# Patient Record
Sex: Female | Born: 1985 | Hispanic: Yes | Marital: Married | Smoking: Never smoker
Health system: Southern US, Community
[De-identification: ages and names within clinical notes are randomized; demographics above are authoritative.]

## PROBLEM LIST (undated history)

## (undated) DIAGNOSIS — Z789 Other specified health status: Secondary | ICD-10-CM

## (undated) DIAGNOSIS — D219 Benign neoplasm of connective and other soft tissue, unspecified: Secondary | ICD-10-CM

## (undated) DIAGNOSIS — E282 Polycystic ovarian syndrome: Secondary | ICD-10-CM

## (undated) DIAGNOSIS — O24419 Gestational diabetes mellitus in pregnancy, unspecified control: Secondary | ICD-10-CM

## (undated) HISTORY — DX: Polycystic ovarian syndrome: E28.2

## (undated) HISTORY — DX: Gestational diabetes mellitus in pregnancy, unspecified control: O24.419

## (undated) HISTORY — DX: Benign neoplasm of connective and other soft tissue, unspecified: D21.9

## (undated) HISTORY — PX: NO PAST SURGERIES: SHX2092

---
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
## 2020-01-06 ENCOUNTER — Encounter (HOSPITAL_COMMUNITY): Payer: Self-pay | Admitting: Obstetrics & Gynecology

## 2020-01-06 ENCOUNTER — Inpatient Hospital Stay (HOSPITAL_COMMUNITY)
Admission: AD | Admit: 2020-01-06 | Discharge: 2020-01-07 | Disposition: A | Discharge status: Home / Self Care | Payer: Self-pay | Attending: Obstetrics & Gynecology | Admitting: Obstetrics & Gynecology

## 2020-01-06 DIAGNOSIS — R102 Pelvic and perineal pain: Secondary | ICD-10-CM | POA: Insufficient documentation

## 2020-01-06 DIAGNOSIS — O209 Hemorrhage in early pregnancy, unspecified: Secondary | ICD-10-CM | POA: Insufficient documentation

## 2020-01-06 DIAGNOSIS — O4690 Antepartum hemorrhage, unspecified, unspecified trimester: Secondary | ICD-10-CM

## 2020-01-06 DIAGNOSIS — Z6790 Unspecified blood type, Rh positive: Secondary | ICD-10-CM

## 2020-01-06 DIAGNOSIS — Z3A09 9 weeks gestation of pregnancy: Secondary | ICD-10-CM | POA: Insufficient documentation

## 2020-01-06 DIAGNOSIS — O26899 Other specified pregnancy related conditions, unspecified trimester: Secondary | ICD-10-CM

## 2020-01-06 DIAGNOSIS — Z3490 Encounter for supervision of normal pregnancy, unspecified, unspecified trimester: Secondary | ICD-10-CM

## 2020-01-06 DIAGNOSIS — B9689 Other specified bacterial agents as the cause of diseases classified elsewhere: Secondary | ICD-10-CM | POA: Insufficient documentation

## 2020-01-06 DIAGNOSIS — Z3A01 Less than 8 weeks gestation of pregnancy: Secondary | ICD-10-CM

## 2020-01-06 DIAGNOSIS — O23591 Infection of other part of genital tract in pregnancy, first trimester: Secondary | ICD-10-CM | POA: Insufficient documentation

## 2020-01-06 LAB — POCT PREGNANCY, URINE: Preg Test, Ur: POSITIVE — AB

## 2020-01-06 NOTE — MAU Note (Signed)
Pt reports to MAU and states that she is pregnant and is bleeding. Pt states that bleeding started at 2230. Pt states she is having pain on her left and right side of her ovaries. Pt states last menstrual period was on 11/02/19. Pt states pain a 5/10. Pt states pain is cramping "not very strong, but I do get them". Pt states that this is her first pregnancy. Pt reports pain in her mid back + CVA tenderness. Pt reports having burning when she pees starting last week. Pt denies any other issues.

## 2020-01-07 ENCOUNTER — Inpatient Hospital Stay (HOSPITAL_COMMUNITY): Payer: Self-pay

## 2020-01-07 DIAGNOSIS — N760 Acute vaginitis: Secondary | ICD-10-CM

## 2020-01-07 DIAGNOSIS — O4691 Antepartum hemorrhage, unspecified, first trimester: Secondary | ICD-10-CM

## 2020-01-07 DIAGNOSIS — O26891 Other specified pregnancy related conditions, first trimester: Secondary | ICD-10-CM

## 2020-01-07 DIAGNOSIS — B9689 Other specified bacterial agents as the cause of diseases classified elsewhere: Secondary | ICD-10-CM

## 2020-01-07 DIAGNOSIS — Z3A01 Less than 8 weeks gestation of pregnancy: Secondary | ICD-10-CM

## 2020-01-07 DIAGNOSIS — R102 Pelvic and perineal pain: Secondary | ICD-10-CM

## 2020-01-07 DIAGNOSIS — O99891 Other specified diseases and conditions complicating pregnancy: Secondary | ICD-10-CM

## 2020-01-07 LAB — WET PREP, GENITAL
Sperm: NONE SEEN
Trich, Wet Prep: NONE SEEN
Yeast Wet Prep HPF POC: NONE SEEN

## 2020-01-07 LAB — CBC
HCT: 39.2 % (ref 36.0–46.0)
Hemoglobin: 12.9 g/dL (ref 12.0–15.0)
MCH: 28.5 pg (ref 26.0–34.0)
MCHC: 32.9 g/dL (ref 30.0–36.0)
MCV: 86.5 fL (ref 80.0–100.0)
Platelets: 204 10*3/uL (ref 150–400)
RBC: 4.53 MIL/uL (ref 3.87–5.11)
RDW: 13.4 % (ref 11.5–15.5)
WBC: 8.3 10*3/uL (ref 4.0–10.5)
nRBC: 0 % (ref 0.0–0.2)

## 2020-01-07 LAB — COMPREHENSIVE METABOLIC PANEL
ALT: 26 U/L (ref 0–44)
AST: 16 U/L (ref 15–41)
Albumin: 4 g/dL (ref 3.5–5.0)
Alkaline Phosphatase: 62 U/L (ref 38–126)
Anion gap: 8 (ref 5–15)
BUN: 10 mg/dL (ref 6–20)
CO2: 25 mmol/L (ref 22–32)
Calcium: 9 mg/dL (ref 8.9–10.3)
Chloride: 105 mmol/L (ref 98–111)
Creatinine, Ser: 0.62 mg/dL (ref 0.44–1.00)
GFR calc Af Amer: >60 mL/min (ref >60)
GFR calc non Af Amer: >60 mL/min (ref >60)
Glucose, Bld: 119 mg/dL — ABNORMAL HIGH (ref 70–99)
Potassium: 3.6 mmol/L (ref 3.5–5.1)
Sodium: 138 mmol/L (ref 135–145)
Total Bilirubin: 0.6 mg/dL (ref 0.3–1.2)
Total Protein: 6.8 g/dL (ref 6.5–8.1)

## 2020-01-07 LAB — URINALYSIS, ROUTINE W REFLEX MICROSCOPIC
Bilirubin Urine: NEGATIVE
Glucose, UA: NEGATIVE mg/dL
Ketones, ur: NEGATIVE mg/dL
Nitrite: NEGATIVE
Protein, ur: NEGATIVE mg/dL
Specific Gravity, Urine: 1.01 (ref 1.005–1.030)
pH: 7 (ref 5.0–8.0)

## 2020-01-07 LAB — HCG, QUANTITATIVE, PREGNANCY: hCG, Beta Chain, Quant, S: 8248 m[IU]/mL — ABNORMAL HIGH (ref <5)

## 2020-01-07 LAB — ABO/RH: ABO/RH(D): O POS

## 2020-01-07 MED ORDER — METRONIDAZOLE 500 MG PO TABS: 500.0000 mg | ORAL_TABLET | Freq: Two times a day (BID) | ORAL | 0 refills | Status: DC

## 2020-01-07 NOTE — Discharge Instructions (Signed)
Las medicinas seguras para tomar Solicitor  Safe Medications in Pregnancy  Acn:  Benzoyl Peroxide (Perxido de benzolo)  Salicylic Acid (cido saliclico)  Dolor de espalda/Dolor de cabeza:  Tylenol: 2 pastillas de concentracin regular cada 4 horas O 2 pastillas de concentracin fuerte cada 6 horas  Resfriados/Tos/Alergias:  Benadryl (sin alcohol) 25 mg cada 6 horas segn lo necesite Breath Right strips (Tiras para respirar correctamente)  Claritin  Cepacol (pastillas de chupar para la garganta)  Chloraseptic (aerosol para la garganta)  Cold-Eeze- hasta tres veces por da  Cough drops (pastillas de chupar para la tos, sin alcohol)  Flonase (con receta mdica solamente)  Guaifenesin  Mucinex  Robitussin DM (simple solamente, sin alcohol)  Saline nasal spray/drops (Aerosol nasal salino/gotas) Sudafed (pseudoephedrine) y  Actifed * utilizar slo despus de 12 semanas de gestacin y si no tiene la presin arterial alta.  Tylenol Vicks  VapoRub  Zinc lozenges (pastillas para la garganta)  Zyrtec  Estreimiento:  Colace  Ducolax (supositorios)  Fleet enema (lavado intestinal rectal)  Glycerin (supositorios)  Metamucil  Milk of magnesia (leche de magnesia)  Miralax  Senokot  Smooth Move (t)  Diarrea:  Kaopectate Imodium A-D  *NO tome Pepto-Bismol  Hemorroides:  Anusol  Anusol HC  Preparation H  Tucks  Indigestin:  Tums  Maalox  Mylanta  Zantac  Pepcid  Insomnia:  Benadryl (sin alcohol) 25mg  cada 6 horas segn lo necesite  Tylenol PM  Unisom, no Gelcaps  Calambres en las piernas:  Tums  MagGel Nuseas/Vmitos:  Bonine  Dramamine  Emetrol  Ginger (extracto)  Sea-Bands  Meclizine  Medicina para las nuseas que puede tomar durante el embarazo: Unisom (doxylamine succinate, pastillas de 25 mg) Tome una pastilla al da al [place]. Si los sntomas no estn adecuadamente controlados, la dosis puede aumentarse hasta una dosis mxima recomendada de [employer] al da (1/2 pastilla por la [place], 1/2 pastilla a media tarde y Ardelia Mems pastilla al [place]). Pastillas de Vitamina B6 de 100mg . Tome [employer] veces al da (hasta 200 mg por da).  Erupciones en la piel:  Productos de Aveeno  Benadryl cream (crema o una dosis de 25mg  cada 6 horas segn lo necesite)  Calamine Lotion (locin)  1% cortisone cream (crema de cortisona de 1%)  nfeccin vaginal por hongos (candidiasis):  Gyne-lotrimin 7  Monistat 7   **Si est tomando varias medicinas, por favor revise las etiquetas para Conservation officer, nature los mismos ingredientes [place]. **Tome la medicina segn lo indicado en la etiqueta. **No tome ms de 400 mg de Tylenol en 24 horas. **No tome medicinas que contengan aspirina o ibuprofeno.       [facility]     Phone: [phone]  [facility] [place]  Phone: [phone]  [facility] [place]  Phone: [place] [facility] [employer] [facility] at [place]                           Phone: [place] [facility] [place] [facility]          Phone: [phone]  [facility]    Phone: [phone]   [facility] [place])    Phone: [facility]    Phone: [phone]  [facility]    Phone: [place]    Phone:  [place] [facility]  Phone: [place]               Phone: [phone]  [facility]   Phone: [phone]  [facility]    Phone: [phone]                    Dolor abdominal durante el embarazo Abdominal Pain During Pregnancy  El dolor abdominal es comn durante el [place] y tiene muchas causas posibles. Algunas causas son ms graves que  otras, y a Curator causa se desconoce. El dolor abdominal puede ser un indicio de que est comenzando el [place]. Tambin puede ser ocasionado por el crecimiento y estiramiento de los msculos y ligamentos durante el Media planner. Siempre informe a su mdico si siente dolor abdominal. Siga estas indicaciones en su casa:  No tenga relaciones sexuales ni se coloque nada dentro de la vagina hasta que el dolor haya desaparecido completamente.  Descanse todo lo que pueda [employer] dolor se le haya calmado.  Beba suficiente lquido para Consulting civil engineer orina de color amarillo plido.  Tome los medicamentos de venta libre y los recetados solamente como se lo haya indicado el mdico.  Consulting civil engineer a todas las visitas de control como se lo haya indicado el mdico. Esto es importante. Comunquese con un mdico si:  El dolor contina o empeora despus de Production assistant, radio.  Siente dolor en la parte inferior del abdomen que: ? Va y viene en intervalos regulares. ? Se extiende a la espalda. ? Es parecido a los Stage manager.  Siente dolor o ardor al Garment/textile technologist. Solicite ayuda de inmediato si:  Tiene fiebre o siente escalofros.  Tiene una hemorragia vaginal abundante.  Tiene una prdida de lquido por la vagina.  Elimina tejidos por la vagina.  Vomita o tiene diarrea durante ms de 24horas.  El beb se mueve menos de lo habitual.  Se siente dbil o se desmaya.  Le falta el aire.  Siente dolor intenso en la parte superior del abdomen. Resumen  El dolor abdominal es comn durante el [place] y tiene muchas causas posibles.  Si siente dolor abdominal durante el embarazo, informe al mdico de inmediato.  Siga las indicaciones del mdico para el cuidado en el hogar y concurra a todas las visitas de control como se lo hayan indicado. Esta informacin no tiene Marine scientist el consejo del mdico. Asegrese de hacerle al mdico cualquier pregunta que tenga. Document Revised: 03/31/2017 Document  Reviewed: 03/31/2017 Elsevier Patient Education  2020 [place] de aborto Threatened Miscarriage  La amenaza de aborto se produce cuando una mujer tiene hemorragia vaginal durante las primeras 20semanas de [place], pero el embarazo no se interrumpe. Si durante este perodo usted tiene hemorragia vaginal, el mdico le har pruebas para asegurarse de que el embarazo contine. Si las pruebas muestran que usted contina embarazada y que el "beb" en desarrollo (feto) dentro del tero sigue creciendo, se considera que tuvo una [place] de aborto. La amenaza de aborto no implica que el embarazo vaya a Manufacturing engineer, pero s aumenta el riesgo de perder el embarazo (aborto completo). Cules son las causas? Por lo general, se desconoce la causa de esta afeccin. Si el resultado final es el aborto completo, la causa ms frecuente es la cantidad anormal de cromosomas del feto. Los cromosomas son las estructuras internas de las clulas que contienen todo Agricultural engineer gentico de [place]  persona. Qu incrementa el riesgo? Los siguientes factores del estilo de vida pueden aumentar el riesgo de aborto al comienzo del embarazo:  Fumar.  El consumo de cantidades excesivas de alcohol o cafena.  El consumo de drogas. Las siguientes enfermedades preexistentes pueden aumentar el riesgo de aborto al comienzo del embarazo:  Sndrome del ovario poliqustico.  Fibromas uterinos.  Infecciones.  Diabetes mellitus. Cules son los signos o los sntomas? Los sntomas de esta afeccin incluyen los siguientes:  Hemorragia vaginal.  Dolor o clicos abdominales leves. Cmo se diagnostica? Si tiene hemorragia con o sin dolor abdominal antes de las 20semanas de [place], el mdico le har pruebas para determinar si el embarazo contina. Estas incluirn lo siguiente:  Ecografa. Este estudio [place] ondas sonoras para crear imgenes del interior del tero. Esto permite que el mdico vea al beb en  gestacin y otras estructuras, como la placenta.  Examen plvico. Este es un examen interno de la vagina y del cuello uterino.  Medicin de la frecuencia cardaca del feto.  Pruebas de laboratorio, como anlisis de [place], [place] de [place] o hisopados para Hydrographic surveyor una infeccin. Es posible que le diagnostiquen una amenaza de aborto en los siguientes casos:  La ecografa muestra que el embarazo contina.  La frecuencia cardaca del feto es alta.  El examen plvico muestra que la apertura entre el tero y la vagina (cuello uterino) est cerrada.  Los [employer] de sangre confirman que el embarazo contina. Cmo se trata? No se ha demostrado que ningn tratamiento evite que una amenaza de aborto se [place] en un aborto completo. Sin embargo, los cuidados [employer] hogar son importantes. Siga estas indicaciones en su casa:  Descanse lo suficiente.  No tenga relaciones sexuales ni use tampones si tiene hemorragia vaginal.  No se haga duchas vaginales.  No fume ni consuma drogas.  No beba alcohol.  Evite la cafena.  Vaya a todas las visitas de control prenatales y de control como se lo haya indicado el mdico. Esto es importante. Comunquese con un mdico si:  Tiene una ligera hemorragia o manchado vaginal durante el embarazo.  Tiene dolor o clicos en el abdomen.  Tiene fiebre. Solicite ayuda de inmediato si:  Tiene una hemorragia vaginal abundante.  Elimina cogulos de sangre por la vagina.  Elimina tejidos por la vagina.  Tiene una prdida de lquido o Optometrist lquido a chorros por Geneticist, molecular.  Siente dolor en la parte baja de la espalda o clicos abdominales intensos.  Tiene fiebre, escalofros y dolor abdominal intenso. Resumen  La amenaza de aborto se produce cuando una mujer tiene hemorragia vaginal durante las primeras 20semanas de [place], pero el embarazo no se interrumpe.  Por lo general, no se conoce la causa de la amenaza de aborto.  Entre  los sntomas de esta afeccin, se incluyen hemorragia vaginal y clicos o dolor abdominal leve.  No se ha demostrado que ningn tratamiento evite que una amenaza de aborto se [place] en un aborto completo.  Vaya a todas las visitas de control prenatales y de control como se lo haya indicado el mdico. Esto es importante. Esta informacin no tiene Marine scientist el consejo del mdico. Asegrese de hacerle al mdico cualquier pregunta que tenga. Document Revised: 06/26/2017 Document Reviewed: 06/26/2017 Elsevier Patient Education  [place].  Hematoma subcorinico Subchorionic Hematoma  Un hematoma subcorinico es una acumulacin de sangre entre la pared externa del embrin (corion) y la pared interna de la matriz (tero). Esta afeccin puede  causar hemorragia vaginal. Si causan poca o nada de hemorragia vaginal, generalmente, los hematomas pequeos que ocurren al principio del Media planner se reducen por su propia cuenta y no afectan al beb ni al [employer]. Cuando la hemorragia comienza ms tarde en el embarazo, o el hematoma es ms grande o se produce en una paciente de edad avanzada, la afeccin puede ser ms grave. Los hematomas ms grandes pueden agrandarse an ms, lo que [employer] de aborto espontneo. Esta afeccin tambin [employer] siguientes riesgos:  Separacin prematura de la placenta del tero.  Parto antes de trmino Public affairs consultant).  Muerte fetal. Cules son las causas? Se desconoce la causa exacta de esta afeccin. Ocurre cuando la sangre queda atrapada entre la placenta y la pared uterina porque la placenta se ha separado del Environmental consultant original del implante. Qu incrementa el riesgo? Es ms probable que desarrolle esta afeccin si:  Recibi tratamiento con medicamentos para la fertilidad.  La concepcin se realiz a travs de la fertilizacin in vitro (FIV). Cules son los signos o los sntomas? Los sntomas de esta afeccin [employer]  siguientes:  Prdida o hemorragia vaginal.  Contracciones del tero. Estas contracciones provocan dolor abdominal. En ocasiones, puede no haber sntomas y Engineer, technical sales solo se puede ver cuando se toman imgenes ecogrficas (ecografa transvaginal). Cmo se diagnostica? Esta afeccin se diagnostica con un examen fsico. Es un examen plvico. Tambin pueden hacerle otros estudios, por ejemplo:  Anlisis de [place].  Anlisis de [place].  Ecografa del abdomen. Cmo se trata? El tratamiento de esta afeccin puede variar. El tratamiento puede incluir lo siguiente:  Observacin cautelosa. La observarn atentamente para detectar cualquier cambio en la hemorragia. Durante esta etapa: ? El hematoma puede reabsorberse en el cuerpo. ? El hematoma puede separar el espacio lleno de lquido que contiene al embrin (saco gestacional) de la pared del tero (endometrio).  Medicamentos.  Restriccin de [employer]. Puede ser necesaria hasta que se detenga la hemorragia. Siga estas indicaciones en su casa:  Haga reposo en cama si se lo indica el mdico.  No levante ningn objeto que pese ms de 10libras (4,5kg) o siga las indicaciones del mdico.  No consuma ningn producto que contenga nicotina o tabaco, como cigarrillos y Psychologist, sport and exercise. Si necesita ayuda para dejar de fumar, consulte al mdico.  Lleve un registro escrito de la cantidad de toallas higinicas que utiliza cada da y cun empapadas (saturadas) estn.  No use tampones.  Concurra a todas las visitas de control como se lo haya indicado el mdico. Esto es importante. El profesional podr pedirle que se realice anlisis de seguimiento, ecografas o [place]. Comunquese con un mdico si:  Tiene una hemorragia vaginal.  Tiene fiebre. Solicite ayuda de inmediato si:  Siente calambres intensos en el estmago, en la espalda, en el abdomen o en la pelvis.  Elimina cogulos o tejidos grandes. Guarde los tejidos para que  su mdico los vea.  Tiene ms hemorragia vaginal, y se desmaya o se siente mareada o dbil. Resumen  Un hematoma subcorinico es una acumulacin de sangre entre la pared externa de la placenta y [place].  Esta afeccin puede causar hemorragia vaginal.  En ocasiones, puede no haber sntomas y Engineer, technical sales solo se puede ver cuando se toman imgenes ecogrficas.  El tratamiento puede incluir una observacin cautelosa, medicamentos o restriccin de [place]. Esta informacin no tiene Marine scientist el consejo del mdico. Asegrese de hacerle al mdico cualquier pregunta que tenga. Document Revised: 07/16/2017 Document Reviewed:  07/16/2017 Elsevier Patient Education  [place] vaginal durante el primer trimestre de Media planner Vaginal Bleeding During Pregnancy, First Trimester  Durante los primeros meses del embarazo es relativamente frecuente que se presente una pequea hemorragia (prdidas) proveniente de la vagina. Normalmente esto se detiene por s solo. Estas hemorragias o prdidas tienen diversas causas al inicio del embarazo. Algunas pueden estar relacionadas al [employer] Corporation y otras no. En muchos casos, la hemorragia es normal y no es un problema. Sin embargo, la hemorragia tambin puede ser un signo de algo grave. Debe informar a su mdico de inmediato si tiene alguna hemorragia vaginal. Algunas causas posibles de hemorragia vaginal durante el primer trimestre incluyen lo siguiente:  Infeccin o inflamacin del cuello uterino.  Crecimientos anormales (plipos) en el cuello uterino.  Aborto espontneo o amenaza de aborto espontneo.  Tejido fetal que se desarrolla fuera del tero (embarazo ectpico).  Una masa de tejido que crece en el tero debido a una mala fertilizacin del vulo (embarazo molar). Siga estas indicaciones en su casa: Actividad  Siga las indicaciones del mdico respecto de las restricciones en las actividades. Pregunte qu actividades  son seguras para usted.  Si es necesario, organcese para que alguien la ayude con las actividades cotidianas.  No tenga relaciones sexuales ni orgasmos hasta que su mdico le diga que es seguro. Instrucciones generales  [employer] de venta libre y los recetados solamente como se lo haya indicado el mdico.  Est atenta a cualquier cambio en los sntomas.  No use tampones ni se haga duchas vaginales.  Lleve un registro de la cantidad de apsitos higinicos que [place] por da, la frecuencia con la que los cambia y cun empapados (saturados) estn.  Si elimina tejido por la vagina, gurdelo para mostrrselo al [employer].  Concurra a todas las visitas de control como se lo haya indicado el mdico. Esto es importante. Comunquese con un mdico si:  Tiene un sangrado vaginal en cualquier momento del embarazo.  Tiene calambres o dolores de [place].  Tiene fiebre. Solicite ayuda de inmediato si:  Tiene clicos intensos en la espalda o el abdomen.  Elimina cogulos grandes o gran cantidad de tejido por la vagina.  La hemorragia aumenta.  Se siente mareada, dbil, o se desmaya.  Tiene escalofros.  Tiene una prdida importante o sale lquido a borbotones por la vagina. Resumen  Durante los primeros meses del embarazo es relativamente frecuente que se presente una pequea hemorragia (prdidas) proveniente de la vagina.  Estas hemorragias o prdidas tienen diversas causas al inicio del embarazo.  Debe informar a su mdico de inmediato si tiene alguna hemorragia vaginal. Esta informacin no tiene como fin reemplazar el consejo del mdico. Asegrese de hacerle al mdico cualquier pregunta que tenga. Document Revised: 07/02/2017 Document Reviewed: 07/02/2017 Elsevier Patient Education  [place].

## 2020-01-07 NOTE — MAU Provider Note (Signed)
History     CSN: [id number]  Arrival date and time: 01/06/20 2318   First Provider Initiated Contact with Patient 01/07/20 0021      Chief Complaint  Patient presents with  . Vaginal Bleeding  . Possible Pregnancy   Ms. Samantha Conner is a 34 y.o. G1P0 at [redacted]w[redacted]d who presents to MAU for vaginal bleeding which began around 1030PM this evening. Pt reports she saw bleeding on the toilet paper after using the bathroom, but denies bleeding in her underwear. Pt describes bleeding as tiny clots, which are dark red in color.  Passing blood clots? Yes, per above Blood soaking clothes? no Lightheaded/dizzy? no Significant pelvic pain or cramping? Yes, intermittent, 5/10 at worst Passed any tissue? no Hx ectopic pregnancy? no Hx of PID, GYN surgery? no  Current pregnancy problems? Pt has not yet been seen Blood Type? unknown Allergies? NKDA Current medications? PNVs Current PNC & next appt? no appt yet  Pt denies other symptoms than listed above. Pt denies vaginal discharge/odor/itching. Pt denies N/V, abdominal pain, constipation, diarrhea, or urinary problems. Pt denies fever, chills, fatigue, sweating or changes in appetite. Pt denies SOB or chest pain. Pt denies dizziness, HA, light-headedness, weakness.  Spanish translation services used for entire visit.   OB History    Gravida  1   Para      Term      Preterm      AB      Living        SAB      TAB      Ectopic      Multiple      Live Births              Past Medical History:  Diagnosis Date  . Medical history non-contributory     Past Surgical History:  Procedure Laterality Date  . NO PAST SURGERIES      History reviewed. No pertinent family history.  Social History   Tobacco Use  . Smoking status: Never Smoker  . Smokeless tobacco: Never Used  Substance Use Topics  . Alcohol use: Never  . Drug use: Never    Allergies: No Known Allergies  No medications prior to admission.     Review of Systems  Constitutional: Negative for chills, diaphoresis, fatigue and fever.  Eyes: Negative for visual disturbance.  Respiratory: Negative for shortness of breath.   Cardiovascular: Negative for chest pain.  Gastrointestinal: Negative for abdominal pain, constipation, diarrhea, nausea and vomiting.  Genitourinary: Positive for pelvic pain and vaginal bleeding. Negative for dysuria, flank pain, frequency, urgency and vaginal discharge.  Neurological: Negative for dizziness, weakness, light-headedness and headaches.   Physical Exam   Blood pressure 136/85, pulse 80, temperature 98.8 F (37.1 C), temperature source Oral, resp. rate 18, weight 64.5 kg, last menstrual period 11/02/2019.  Patient Vitals for the past 24 hrs:  BP Temp Temp src Pulse Resp Weight  01/06/20 2349 136/85 98.8 F (37.1 C) Oral 80 18 64.5 kg   Physical Exam  Constitutional: She is oriented to person, place, and time. She appears well-developed and well-nourished. No distress.  HENT:  Head: Normocephalic and atraumatic.  Respiratory: Effort normal.  GI: Soft. She exhibits no distension and no mass. There is abdominal tenderness in the right lower quadrant and left lower quadrant. There is no rebound and no guarding.  Genitourinary: There is no rash, tenderness or lesion on the right labia. There is no rash, tenderness or lesion on the left  labia. Cervix exhibits no motion tenderness, no discharge and no friability. Right adnexum displays no mass, no tenderness and no fullness. Left adnexum displays no mass, no tenderness and no fullness.    Vaginal bleeding (small clot present at os and gently removed with Fox swab, minimal amount of bright red bleeding behind clot) present.     No vaginal discharge or tenderness.  There is bleeding (small clot present at os and gently removed with Fox swab, minimal amount of bright red bleeding behind clot) in the vagina. No tenderness in the vagina.    Genitourinary  Comments: CE: long/closed/posterior   Neurological: She is alert and oriented to person, place, and time.  Skin: Skin is warm and dry. She is not diaphoretic.  Psychiatric: She has a normal mood and affect. Her behavior is normal. Judgment and thought content normal.   Results for orders placed or performed during the hospital encounter of 01/06/20 (from the past 24 hour(s))  Urinalysis, Routine w reflex microscopic     Status: Abnormal   Collection Time: 01/06/20 11:21 PM  Result Value Ref Range   Color, Urine YELLOW YELLOW   APPearance CLEAR CLEAR   Specific Gravity, Urine 1.010 1.005 - 1.030   pH 7.0 5.0 - 8.0   Glucose, UA NEGATIVE NEGATIVE mg/dL   Hgb urine dipstick SMALL (A) NEGATIVE   Bilirubin Urine NEGATIVE NEGATIVE   Ketones, ur NEGATIVE NEGATIVE mg/dL   Protein, ur NEGATIVE NEGATIVE mg/dL   Nitrite NEGATIVE NEGATIVE   Leukocytes,Ua TRACE (A) NEGATIVE   WBC, UA 0-5 0 - 5 WBC/hpf   Bacteria, UA MANY (A) NONE SEEN   Squamous Epithelial / LPF 0-5 0 - 5  Pregnancy, urine POC     Status: Abnormal   Collection Time: 01/06/20 11:30 PM  Result Value Ref Range   Preg Test, Ur POSITIVE (A) NEGATIVE  CBC     Status: None   Collection Time: 01/06/20 11:57 PM  Result Value Ref Range   WBC 8.3 4.0 - 10.5 K/uL   RBC 4.53 3.87 - 5.11 MIL/uL   Hemoglobin 12.9 12.0 - 15.0 g/dL   HCT 39.2 36.0 - 46.0 %   MCV 86.5 80.0 - 100.0 fL   MCH 28.5 26.0 - 34.0 pg   MCHC 32.9 30.0 - 36.0 g/dL   RDW 13.4 11.5 - 15.5 %   Platelets 204 150 - 400 K/uL   nRBC 0.0 0.0 - 0.2 %  Comprehensive metabolic panel     Status: Abnormal   Collection Time: 01/06/20 11:57 PM  Result Value Ref Range   Sodium 138 135 - 145 mmol/L   Potassium 3.6 3.5 - 5.1 mmol/L   Chloride 105 98 - 111 mmol/L   CO2 25 22 - 32 mmol/L   Glucose, Bld 119 (H) 70 - 99 mg/dL   BUN 10 6 - 20 mg/dL   Creatinine, Ser 0.62 0.44 - 1.00 mg/dL   Calcium 9.0 8.9 - 10.3 mg/dL   Total Protein 6.8 6.5 - 8.1 g/dL   Albumin 4.0 3.5 - 5.0  g/dL   AST 16 15 - 41 U/L   ALT 26 0 - 44 U/L   Alkaline Phosphatase 62 38 - 126 U/L   Total Bilirubin 0.6 0.3 - 1.2 mg/dL   GFR calc non Af Amer >60 >60 mL/min   GFR calc Af Amer >60 >60 mL/min   Anion gap 8 5 - 15  ABO/Rh     Status: None   Collection Time: 01/06/20  11:57 PM  Result Value Ref Range   ABO/RH(D) O POS    No rh immune globuloin      NOT A RH IMMUNE GLOBULIN CANDIDATE, PT RH POSITIVE Performed at [facility] [address], [place], [place] [address]   hCG, quantitative, pregnancy     Status: Abnormal   Collection Time: 01/06/20 11:57 PM  Result Value Ref Range   hCG, Beta Chain, Quant, S 8,248 (H) <5 mIU/mL   No results found.  MAU Course  Procedures  MDM -r/o ectopic -UA: sm hgb/trace leuks/rare bacteria, sending urine for culture -CBC: WNL -CMP: WNL -[place]: single IUP, [redacted]w[redacted]d, +FHR (too small to trace), no [place] identified -hCG: 8,248 -ABO: O Positive -WetPrep: +ClueCells (will treat given lower abdominal discomfort), otherwise WNL -GC/CT collected -pt discharged to home in stable condition  Orders Placed This Encounter  Procedures  . Wet prep, genital    Standing Status:   Standing    Number of Occurrences:   1  . Culture, OB Urine    Standing Status:   Standing    Number of Occurrences:   1  . [place] MFM OB Comp Less 14 Wks    Standing Status:   Standing    Number of Occurrences:   1    Order Specific Question:   Symptom/Reason for Exam    Answer:   Vaginal bleeding in pregnancy [id number]  . Urinalysis, Routine w reflex microscopic    Standing Status:   Standing    Number of Occurrences:   1  . CBC    Standing Status:   Standing    Number of Occurrences:   1  . Comprehensive metabolic panel    Standing Status:   Standing    Number of Occurrences:   1  . hCG, quantitative, pregnancy    Standing Status:   Standing    Number of Occurrences:   1  . Pregnancy, urine POC    Standing Status:   Standing    Number of Occurrences:   1  . ABO/Rh     Standing Status:   Standing    Number of Occurrences:   1  . Discharge patient    Order Specific Question:   Discharge disposition    Answer:   01-Home or Self Care [1]    Order Specific Question:   Discharge patient date    Answer:   01/07/2020   Meds ordered this encounter  Medications  . metroNIDAZOLE (FLAGYL) 500 MG tablet    Sig: Take 1 tablet (500 mg total) by mouth 2 (two) times daily for 7 days.    Dispense:  14 tablet    Refill:  0    Order Specific Question:   Supervising Provider    Answer:   Clovia Cuff C [[id number]]   Assessment and Plan   1. Vaginal bleeding in pregnancy   2. Blood type, Rh positive   3. [redacted] weeks gestation of pregnancy   4. Bacterial vaginosis   5. Pelvic pain in pregnancy   6. Intrauterine pregnancy    Allergies as of 01/07/2020   No Known Allergies     Medication List    TAKE these medications   metroNIDAZOLE 500 MG tablet Commonly known as: Flagyl Take 1 tablet (500 mg total) by mouth 2 (two) times daily for 7 days.      -will call with culture results, if positive -discussed possible causes of bleeding in first trimester -discussed treatment for BV -  pt to schedule NOB visit -safe meds in pregnancy list given -discussed [facility] vs. Miscarriage -return MAU precautions given -pt discharged to home in stable condition  Elmyra Ricks E Glennette Galster 01/07/2020, 2:42 AM

## 2020-01-08 ENCOUNTER — Other Ambulatory Visit: Payer: Self-pay

## 2020-01-08 ENCOUNTER — Inpatient Hospital Stay (HOSPITAL_COMMUNITY)
Admission: AD | Admit: 2020-01-08 | Discharge: 2020-01-09 | Disposition: A | Discharge status: Home / Self Care | Payer: Self-pay | Attending: Obstetrics and Gynecology | Admitting: Obstetrics and Gynecology

## 2020-01-08 ENCOUNTER — Emergency Department (HOSPITAL_COMMUNITY)
Admission: EM | Admit: 2020-01-08 | Discharge: 2020-01-08 | Disposition: A | Discharge status: Home / Self Care | Payer: Self-pay | Attending: Emergency Medicine | Admitting: Emergency Medicine

## 2020-01-08 ENCOUNTER — Encounter (HOSPITAL_COMMUNITY): Payer: Self-pay | Admitting: Obstetrics and Gynecology

## 2020-01-08 ENCOUNTER — Encounter (HOSPITAL_COMMUNITY): Payer: Self-pay

## 2020-01-08 DIAGNOSIS — Z3A01 Less than 8 weeks gestation of pregnancy: Secondary | ICD-10-CM | POA: Insufficient documentation

## 2020-01-08 DIAGNOSIS — O034 Incomplete spontaneous abortion without complication: Secondary | ICD-10-CM

## 2020-01-08 DIAGNOSIS — O209 Hemorrhage in early pregnancy, unspecified: Secondary | ICD-10-CM | POA: Insufficient documentation

## 2020-01-08 DIAGNOSIS — O039 Complete or unspecified spontaneous abortion without complication: Secondary | ICD-10-CM

## 2020-01-08 DIAGNOSIS — O4690 Antepartum hemorrhage, unspecified, unspecified trimester: Secondary | ICD-10-CM

## 2020-01-08 DIAGNOSIS — O200 Threatened abortion: Secondary | ICD-10-CM | POA: Insufficient documentation

## 2020-01-08 LAB — URINALYSIS, ROUTINE W REFLEX MICROSCOPIC
Bilirubin Urine: NEGATIVE
Glucose, UA: NEGATIVE mg/dL
Ketones, ur: NEGATIVE mg/dL
Leukocytes,Ua: NEGATIVE
Nitrite: NEGATIVE
Protein, ur: 30 mg/dL — AB
Specific Gravity, Urine: 1.028 (ref 1.005–1.030)
pH: 7 (ref 5.0–8.0)

## 2020-01-08 LAB — CULTURE, OB URINE: Culture: >=100000 — AB

## 2020-01-08 LAB — HEMOGLOBIN AND HEMATOCRIT, BLOOD
HCT: 41.1 % (ref 36.0–46.0)
Hemoglobin: 13.4 g/dL (ref 12.0–15.0)

## 2020-01-08 LAB — HCG, QUANTITATIVE, PREGNANCY: hCG, Beta Chain, Quant, S: 9291 m[IU]/mL — ABNORMAL HIGH (ref <5)

## 2020-01-08 MED ORDER — ACETAMINOPHEN 325 MG PO TABS
650.0000 mg | ORAL_TABLET | Freq: Once | ORAL | Status: AC
  Administered 2020-01-08: 19:00:00 650 mg via ORAL
  Filled 2020-01-08: qty 2

## 2020-01-08 NOTE — Discharge Instructions (Addendum)
Si sangra a travs de [employer] de 2 horas, debe regresar al [facility] [place]. Si se siente aturdido o mareado, busque una reevaluacin.

## 2020-01-08 NOTE — MAU Note (Signed)
..  Samantha Conner is a 34 y.o. at [redacted]w[redacted]d here in MAU reporting: vaginal bleeding. Pt states she was seen in the [facility] today and was told to come over to MAU. Pt states she had a brown bleeding on 01/06/20 and was seen in MAU. She has returned today because she began bleeding at 1700 and it has changed from brown to a bright red. States she has saturated 2 pads since then. Pt reports that earlier today she had abdominal cramping but was given tylenol in the ED and it has resolved.

## 2020-01-08 NOTE — ED Triage Notes (Signed)
Pt presents with c/o vaginal bleeding that started on Friday. Pt reports she is [redacted] weeks pregnant.

## 2020-01-08 NOTE — MAU Provider Note (Signed)
History     CSN: [id number]  Arrival date and time: 01/08/20 2208   First Provider Initiated Contact with Patient 01/08/20 2307      No chief complaint on file.  Vaginal Bleeding The patient's primary symptoms include vaginal bleeding. This is a recurrent problem. Episode onset: 3 days ago, was seen in MAU on 3/19 for same complaint. The problem occurs intermittently. The problem has been gradually worsening. The patient is experiencing no pain. She is pregnant. Pertinent negatives include no abdominal pain, constipation, diarrhea, dysuria, fever, flank pain, frequency, nausea or urgency. The vaginal discharge was dark and thick (red). The vaginal bleeding is heavier than menses. She has been passing clots. Passing tissue: unsure. Nothing aggravates the symptoms. She has tried nothing for the symptoms. No, her partner does not have an STD. She uses nothing for contraception.    OB History    Gravida  1   Para      Term      Preterm      AB      Living        SAB      TAB      Ectopic      Multiple      Live Births              Past Medical History:  Diagnosis Date  . Medical history non-contributory     Past Surgical History:  Procedure Laterality Date  . NO PAST SURGERIES      History reviewed. No pertinent family history.  Social History   Tobacco Use  . Smoking status: Never Smoker  . Smokeless tobacco: Never Used  Substance Use Topics  . Alcohol use: Never  . Drug use: Never    Allergies: No Known Allergies  Medications Prior to Admission  Medication Sig Dispense Refill Last Dose  . metroNIDAZOLE (FLAGYL) 500 MG tablet Take 1 tablet (500 mg total) by mouth 2 (two) times daily for 7 days. 14 tablet 0 01/08/2020 at Unknown time    Review of Systems  Constitutional: Negative for fever.  HENT: Negative.   Eyes: Negative.   Respiratory: Negative.   Cardiovascular: Negative.   Gastrointestinal: Negative for abdominal pain, constipation,  diarrhea and nausea.  Endocrine: Negative.   Genitourinary: Positive for vaginal bleeding. Negative for dysuria, flank pain, frequency and urgency.  Musculoskeletal: Negative.   Skin: Negative.   Allergic/Immunologic: Negative.   Neurological: Negative.   Hematological: Negative.   Psychiatric/Behavioral: Negative.    Physical Exam   Blood pressure 127/83, pulse 81, temperature 99.6 F (37.6 C), temperature source Oral, resp. rate 15, last menstrual period 11/02/2019.  Physical Exam  Nursing note and vitals reviewed. Constitutional: She is oriented to person, place, and time. She appears well-developed and well-nourished.  HENT:  Head: Normocephalic and atraumatic.  Eyes: Pupils are equal, round, and reactive to light. Conjunctivae and EOM are normal.  Cardiovascular: Normal rate, regular rhythm, normal heart sounds and intact distal pulses.  Respiratory: Effort normal and breath sounds normal.  GI: Soft. Bowel sounds are normal. She exhibits no distension and no mass. There is no abdominal tenderness. There is no rebound and no guarding.  Genitourinary:    No vaginal discharge.     Genitourinary Comments: Dark, red, thick clots with clear mucous protruding from cervical Os and in the vaginal vault. Cervical Os is open.   Musculoskeletal:        General: Normal range of motion.     Cervical  back: Normal range of motion and neck supple.  Neurological: She is alert and oriented to person, place, and time. She has normal reflexes.  Skin: Skin is warm and dry.  Psychiatric: She has a normal mood and affect. Her behavior is normal. Judgment and thought content normal.    MAU Course  Procedures  MDM - likely SAB (inevitable v incomplete) - CBC, Type and Screen - Transvaginal US to evaluate for products of conception  Results for orders placed or performed during the hospital encounter of 01/08/20 (from the past 24 hour(s))  Urinalysis, Routine w reflex microscopic     Status:  Abnormal   Collection Time: 01/08/20 11:19 PM  Result Value Ref Range   Color, Urine YELLOW YELLOW   APPearance HAZY (A) CLEAR   Specific Gravity, Urine 1.028 1.005 - 1.030   pH 7.0 5.0 - 8.0   Glucose, UA NEGATIVE NEGATIVE mg/dL   Hgb urine dipstick MODERATE (A) NEGATIVE   Bilirubin Urine NEGATIVE NEGATIVE   Ketones, ur NEGATIVE NEGATIVE mg/dL   Protein, ur 30 (A) NEGATIVE mg/dL   Nitrite NEGATIVE NEGATIVE   Leukocytes,Ua NEGATIVE NEGATIVE   RBC / HPF 11-20 0 - 5 RBC/hpf   WBC, UA 0-5 0 - 5 WBC/hpf   Bacteria, UA RARE (A) NONE SEEN   Squamous Epithelial / LPF 0-5 0 - 5   Mucus PRESENT   CBC     Status: None   Collection Time: 01/09/20 12:03 AM  Result Value Ref Range   WBC 7.7 4.0 - 10.5 K/uL   RBC 4.58 3.87 - 5.11 MIL/uL   Hemoglobin 13.1 12.0 - 15.0 g/dL   HCT 39.6 36.0 - 46.0 %   MCV 86.5 80.0 - 100.0 fL   MCH 28.6 26.0 - 34.0 pg   MCHC 33.1 30.0 - 36.0 g/dL   RDW 13.5 11.5 - 15.5 %   Platelets 209 150 - 400 K/uL   nRBC 0.0 0.0 - 0.2 %  Type and screen [place]     Status: None   Collection Time: 01/09/20 12:03 AM  Result Value Ref Range   ABO/RH(D) O POS    Antibody Screen NEG    Sample Expiration      01/12/2020,2359 Performed at [facility], [address], [place], [place] [address]    Transvaginal US shows HR of 87, mobile gestational sac.  Assessment and Plan  34 yo G1P0 at 6.2 EGA by LMP who presented for vaginal bleeding -Likely inevitable abortion -Given options of cytotec v expectant management -Patient and husband would like to manage expectantly -Return precautions given -Message sent to [facility] clinic for HCG quant in 1 week, provider visit in 2 weeks -DC to home  [place] 01/08/2020, 11:28 PM

## 2020-01-08 NOTE — ED Provider Notes (Addendum)
[facility] DEPT Provider Note   CSN: [id number] Arrival date & time: 01/08/20  1718   History Chief Complaint  Patient presents with  . Vaginal Bleeding   Samantha Conner is a 34 y.o. female with no significant past medical history who presents for evaluation of vaginal bleeding.  Patient seen MAU almost 2 days ago.  Had ultrasound which showed IUP at 6 weeks 0 days.  She is a G1 P0.  Patient states she has continued to have intermittent bright red vaginal bleeding.  Has been having some clots today.  She is changing a panty liner every 4 hours.  She denies any lightheadedness or dizziness.  States she does have intermittent lower abdominal cramping.  She is not take anything for this.  She was also diagnosed with BV 2 days ago.  Denies fever, chills, nausea, vomiting, chest pain, shortness of breath, syncope, pelvic pain, vaginal discharge.  Denies additional aggravating or alleviating factors.  History obtained from patient past medical record.  Medical spanish interpreter is used.  HPI     Past Medical History:  Diagnosis Date  . Medical history non-contributory     There are no problems to display for this patient.   Past Surgical History:  Procedure Laterality Date  . NO PAST SURGERIES       OB History    Gravida  1   Para      Term      Preterm      AB      Living        SAB      TAB      Ectopic      Multiple      Live Births              History reviewed. No pertinent family history.  Social History   Tobacco Use  . Smoking status: Never Smoker  . Smokeless tobacco: Never Used  Substance Use Topics  . Alcohol use: Never  . Drug use: Never    Home Medications Prior to Admission medications   Medication Sig Start Date End Date Taking? Authorizing Provider  metroNIDAZOLE (FLAGYL) 500 MG tablet Take 1 tablet (500 mg total) by mouth 2 (two) times daily for 7 days. 01/07/20 01/14/20  Nugent, Gerrie Nordmann, NP     Allergies    Patient has no known allergies.  Review of Systems   Review of Systems  Constitutional: Negative.   HENT: Negative.   Respiratory: Negative.   Cardiovascular: Negative.   Gastrointestinal: Positive for abdominal pain. Negative for abdominal distention, anal bleeding, blood in stool, constipation, diarrhea, nausea, rectal pain and vomiting.  Genitourinary: Positive for vaginal bleeding. Negative for decreased urine volume, difficulty urinating, dysuria, flank pain, frequency, genital sores, hematuria, pelvic pain, urgency, vaginal discharge and vaginal pain.  Musculoskeletal: Negative.   Skin: Negative.   Neurological: Negative.   All other systems reviewed and are negative.   Physical Exam Updated Vital Signs BP 116/75 (BP Location: Left Arm)   Pulse 86   Temp 98.3 F (36.8 C)   Resp 16   LMP 11/02/2019 (Exact Date)   SpO2 99%   Physical Exam Vitals and nursing note reviewed. Exam conducted with a chaperone present.  Constitutional:      General: She is not in acute distress.    Appearance: She is well-developed. She is not ill-appearing, toxic-appearing or diaphoretic.  HENT:     Head: Normocephalic and atraumatic.  Nose: Nose normal.     Mouth/Throat:     Mouth: Mucous membranes are moist.     Pharynx: Oropharynx is clear.  Eyes:     Pupils: Pupils are equal, round, and reactive to light.  Cardiovascular:     Rate and Rhythm: Normal rate.     Pulses: Normal pulses.     Heart sounds: Normal heart sounds.  Pulmonary:     Effort: Pulmonary effort is normal. No respiratory distress.     Breath sounds: Normal breath sounds.  Abdominal:     General: Bowel sounds are normal. There is no distension.     Palpations: There is no mass.     Tenderness: There is no abdominal tenderness. There is no right CVA tenderness, left CVA tenderness, guarding or rebound.     Hernia: No hernia is present.  Genitourinary:    Comments: Bright red bleeding with dime sized  clots at cervical vault. No brisk cervical bleeding. No CMT or adnexal tenderness. Musculoskeletal:        General: Normal range of motion.     Cervical back: Normal range of motion.  Skin:    General: Skin is warm and dry.  Neurological:     Mental Status: She is alert.     ED Results / Procedures / Treatments   Labs (all labs ordered are listed, but only abnormal results are displayed) Labs Reviewed  HCG, QUANTITATIVE, PREGNANCY - Abnormal; Notable for the following components:      Result Value   hCG, Beta Chain, Quant, S 9,291 (*)    All other components within normal limits  HEMOGLOBIN AND HEMATOCRIT, BLOOD    EKG None  Radiology No results found.  Procedures Procedures (including critical care time)  Medications Ordered in ED Medications  acetaminophen (TYLENOL) tablet 650 mg (650 mg Oral Given 01/08/20 1839)   ED Course  I have reviewed the triage vital signs and the nursing notes.  Pertinent labs & imaging results that were available during my care of the patient were reviewed by me and considered in my medical decision making (see chart for details).  34 year old G1, P0, 6 weeks 2 days by ultrasound however [redacted]w[redacted]d by dates presents for ration vaginal bleeding.  Seen MAU almost 2 days ago for similar complaints.  Ultrasound showed single IUP.  Patient with continued bleeding.  GU exam with bright red bleeding at vault with small, dime size clots.  No hemorrhage.  Plan on repeat quant, hemoglobin.  Given she had ultrasound less than 48 hours ago I do not think this is necessary to repeat given single IUP on prior US.  She was not on any fertility meds.   Per MAU note  -UA: sm hgb/trace leuks/rare bacteria, sending urine for culture -CBC: WNL -CMP: WNL -[place]: single IUP, [redacted]w[redacted]d, +FHR (too small to trace), no [place] identified -hCG: 8,248 -ABO: O Positive-- Does not need Rhogam -WetPrep: +ClueCells (will treat given lower abdominal discomfort), otherwise WNL -GC/CT  collected  HGB 13.4, 12.9-- 2 days ago Quant HcG 9291, from [place]   Patient reassessed. Pain controlled.  Ambulatory without difficulty.  No lightheadedness or dizziness.  Orthostatic vital signs negative.  Tolerating PO intake without difficulty.  Discussion with patient via interpreter that she has not doubled her hCG over 48 hours.  Concern for miscarriage given vaginal bleeding. She is hemodynamically stable.  Discussed with patient close follow-up with OB/GYN outpatient.  Discussed strict return precautions for returning to MAU or ED if bleeding increases  or she become lightheaded or dizzy via interpretor.  She will continue her Flagyl for her BV dx at MAU 2 days ago.  The patient has been appropriately medically screened and/or stabilized in the ED. I have low suspicion for any other emergent medical condition which would require further screening, evaluation or treatment in the ED or require inpatient management.  Patient is hemodynamically stable and in no acute distress.  Patient able to ambulate in department prior to ED.  Evaluation does not show acute pathology that would require ongoing or additional emergent interventions while in the emergency department or further inpatient treatment.  I have discussed the diagnosis with the patient and answered all questions.  Pain is been managed while in the emergency department and patient has no further complaints prior to discharge.  Patient is comfortable with plan discussed in room and is stable for discharge at this time.  I have discussed strict return precautions for returning to the emergency department.  Patient was encouraged to follow-up with PCP/specialist refer to at discharge.    MDM Rules/Calculators/A&P                       Final Clinical Impression(s) / ED Diagnoses Final diagnoses:  Vaginal bleeding in pregnancy  Threatened miscarriage    Rx / DC Orders ED Discharge Orders    None       Henderly, Britni A, PA-C  01/08/20 2044    Henderly, Britni A, PA-C 01/09/20 1435    Daleen Bo, MD 01/09/20 2326

## 2020-01-09 ENCOUNTER — Telehealth: Payer: Self-pay | Admitting: Family Medicine

## 2020-01-09 ENCOUNTER — Inpatient Hospital Stay (HOSPITAL_COMMUNITY): Payer: Self-pay

## 2020-01-09 DIAGNOSIS — O034 Incomplete spontaneous abortion without complication: Secondary | ICD-10-CM

## 2020-01-09 LAB — TYPE AND SCREEN
ABO/RH(D): O POS
Antibody Screen: NEGATIVE

## 2020-01-09 LAB — CBC
HCT: 39.6 % (ref 36.0–46.0)
Hemoglobin: 13.1 g/dL (ref 12.0–15.0)
MCH: 28.6 pg (ref 26.0–34.0)
MCHC: 33.1 g/dL (ref 30.0–36.0)
MCV: 86.5 fL (ref 80.0–100.0)
Platelets: 209 10*3/uL (ref 150–400)
RBC: 4.58 MIL/uL (ref 3.87–5.11)
RDW: 13.5 % (ref 11.5–15.5)
WBC: 7.7 10*3/uL (ref 4.0–10.5)
nRBC: 0 % (ref 0.0–0.2)

## 2020-01-09 LAB — GC/CHLAMYDIA PROBE AMP (~~LOC~~) NOT AT ARMC
Chlamydia: NEGATIVE
Comment: NEGATIVE
Comment: NORMAL
Neisseria Gonorrhea: NEGATIVE

## 2020-01-09 NOTE — Discharge Instructions (Signed)
Aborto espontneo Miscarriage El aborto espontneo es la prdida de un beb que no ha nacido (feto) antes de la [id number] del Media planner. La mayor parte de los abortos espontneos ocurre durante los primeros 19meses de Media planner. A veces, un aborto ocurre antes de que la mujer sepa que est [place]. El aborto espontneo puede ser Ardelia Mems experiencia que afecte emocionalmente a Geologist, engineering. Si ha sufrido un aborto espontneo, hable con su mdico y hgale las preguntas que tenga sobre el aborto espontneo, el proceso de duelo y los planes futuros de [place]. Cules son las causas? [place] un aborto espontneo se incluyen las siguientes:  Problemas genticos o cromosmicos del feto. Estos problemas impiden que el beb se desarrolle con normalidad. En general, son el resultado de errores fortuitos que ocurren en la etapa temprana del desarrollo y que no se transmiten de padres a hijos (no se heredan).  Infeccin en el cuello del tero.  Trastornos que afectan el equilibrio hormonal del organismo.  Problemas en el cuello del tero, como su adelgazamiento y apertura antes de que el embarazo llegue a trmino (insuficiencia del cuello de tero).  Problemas en el tero. Estos pueden incluir, [employer], los siguientes: ? Forma anormal del tero. ? Fibromas en el tero. ? Anormalidades congnitas. Estos son problemas que ya estaban presentes en el nacimiento.  Ciertas enfermedades crnicas.  Fumar, beber alcohol o usar drogas.  Lesiones (traumatismos). En muchos de [employer], se desconoce la causa de los abortos espontneos. Cules son los signos o los sntomas? Los sntomas de esta afeccin incluyen los siguientes:  Sangrado o manchado vaginal, con o sin clicos o dolor.  Dolor o clicos en el abdomen o en la parte inferior de la espalda.  Eliminacin de lquido, tejidos o cogulos sanguneos por la vagina. Cmo se diagnostica? Esta afeccin se puede diagnosticar en funcin de  lo siguiente:  Examen fsico.  Ecografa.  Anlisis de [place].  Anlisis de [place]. Cmo se trata? En algunos casos, el tratamiento de un aborto espontneo no es necesario si se eliminan de forma natural todos los tejidos que se Health visitor. Si fuera necesario realizar un tratamiento por esta afeccin, este puede incluir lo siguiente:  Dilatacin y curetaje (D&C). Mediante este procedimiento, se expande el cuello del tero y se raspan las paredes (endometrio). Esto se realiza solamente si queda tejido del feto o la placenta dentro del cuerpo (aborto espontneo incompleto).  Medicamentos, por ejemplo: ? Antibiticos para tratar una infeccin. ? Medicamentos para ayudar al cuerpo a eliminar los restos de tejido. ? Medicamentos para reducir Occupational psychologist) el tamao del tero. Estos medicamentos se pueden administrar si tiene un sangrado abundante. Si su factor sanguneo es Rhnegativo y [place] de su beb es Rhpositivo, usted Print production planner inyeccin del medicamento llamado inmunoglobulinaRhpara proteger a los bebs futuros de Best boy problemas con el factorsanguneoRh. Los trminos "Rhnegativo" y "Rhpositivo" hacen referencia a la presencia o no en la sangre de una protena especfica que se encuentra en la superficie de los glbulos rojos (factor Rh). Siga estas indicaciones en su casa: Medicamentos   [employer] de venta libre y los recetados solamente como se lo haya indicado el mdico.  Si le recetaron antibiticos, tmelos como se lo haya indicado el mdico. No deje de tomar los antibiticos aunque comience a Sports administrator.  No tome antiinflamatorios no esteroideos (AINE), tales como aspirina e ibuprofeno, a menos que se lo indique el mdico. Estos medicamentos pueden provocarle sangrado. [employer]  reposo segn lo indicado. Pregntele al mdico qu actividades son seguras para usted.  Pdale a alguien que la ayude con las responsabilidades familiares y del  hogar durante este tiempo. Instrucciones generales  Lleve un registro de la cantidad y la saturacin de las toallas higinicas que Medical laboratory scientific officer. Anote esta informacin.  Anote la cantidad de tejido o cogulos sanguneos que expulsa por la vagina. Guarde las cantidades grandes de tejidos para que el [employer] examine.  No use tampones, no se haga duchas vaginales ni tenga relaciones sexuales hasta que el mdico la autorice.  Para que usted y su pareja puedan sobrellevar el proceso del duelo, hable con su mdico o busque apoyo psicolgico.  Cuando est lista, visite a su mdico para hablar sobre los pasos importantes que deber seguir en relacin con su salud. Tambin hable [employer] que deber tomar para tener un embarazo saludable en el futuro.  Concurra a todas las visitas de seguimiento como se lo haya indicado el mdico. Esto es importante. Dnde encontrar ms informacin  Cole[employer] Est[employer]ense de [place] y Careers information officer of Obstetricians and Gynecologists): [email or web address] y [place], [place] (U.S. Department of Health and [employer], Office on [employer]): [email or web address] [employer] con un mdico si:  Tiene fiebre o siente escalofros.  Tiene una secrecin vaginal con mal olor.  El sangrado aumenta en vez de disminuir. Solicite ayuda de inmediato si:  Siente calambres intensos o dolor en la espalda o en el abdomen.  Elimina cogulos de sangre o tejido por la vagina del tamao de una nuez o ms grandes.  Necesita ms de una toalla higinica de tamao regular por hora.  Se siente mareada o dbil.  Se desmaya.  Siente una tristeza que la invade o Interior and spatial designer. Resumen  La mayor parte de los abortos espontneos ocurre durante los primeros 90meses de Media planner. En algunos casos, el aborto espontneo ocurre antes de que la mujer sepa que est  [place].  Siga las indicaciones del mdico para el cuidado Financial planner. Concurra a todas las visitas de control.  Para que usted y su pareja puedan sobrellevar el proceso del duelo, hable con su mdico o busque apoyo psicolgico. Esta informacin no tiene Marine scientist el consejo del mdico. Asegrese de hacerle al mdico cualquier pregunta que tenga. Document Revised: 07/13/2017 Document Reviewed: 07/13/2017 Elsevier Patient Education  2020 [employer].

## 2020-01-09 NOTE — Telephone Encounter (Signed)
Called the patient with the intrepreter line. Left a voicemail advising of the upcoming appointment with the date and time. If she has any questions or concerns please contact our office at [phone].

## 2020-01-14 ENCOUNTER — Other Ambulatory Visit: Payer: Self-pay

## 2020-01-14 ENCOUNTER — Inpatient Hospital Stay (HOSPITAL_COMMUNITY)
Admission: AD | Admit: 2020-01-14 | Discharge: 2020-01-14 | Disposition: A | Discharge status: Home / Self Care | Payer: Self-pay | Attending: Obstetrics & Gynecology | Admitting: Obstetrics & Gynecology

## 2020-01-14 ENCOUNTER — Inpatient Hospital Stay (HOSPITAL_COMMUNITY): Payer: Self-pay

## 2020-01-14 ENCOUNTER — Encounter (HOSPITAL_COMMUNITY): Payer: Self-pay | Admitting: Obstetrics & Gynecology

## 2020-01-14 DIAGNOSIS — Z3A01 Less than 8 weeks gestation of pregnancy: Secondary | ICD-10-CM | POA: Insufficient documentation

## 2020-01-14 DIAGNOSIS — O039 Complete or unspecified spontaneous abortion without complication: Secondary | ICD-10-CM | POA: Insufficient documentation

## 2020-01-14 DIAGNOSIS — R109 Unspecified abdominal pain: Secondary | ICD-10-CM | POA: Insufficient documentation

## 2020-01-14 MED ORDER — OXYCODONE-ACETAMINOPHEN 5-325 MG PO TABS: 1.0000 | ORAL_TABLET | Freq: Four times a day (QID) | ORAL | 0 refills | Status: DC | PRN

## 2020-01-14 MED ORDER — KETOROLAC TROMETHAMINE 30 MG/ML IJ SOLN
30.0000 mg | Freq: Once | INTRAMUSCULAR | Status: AC
  Administered 2020-01-14: 30 mg via INTRAMUSCULAR
  Filled 2020-01-14: qty 1

## 2020-01-14 MED ORDER — IBUPROFEN 600 MG PO TABS: 600.0000 mg | ORAL_TABLET | Freq: Four times a day (QID) | ORAL | 1 refills | Status: DC | PRN

## 2020-01-14 NOTE — Discharge Instructions (Signed)
Aborto espontneo Miscarriage El aborto espontneo es la prdida de un beb que no ha nacido (feto) antes de la [id number] del Media planner. La mayor parte de los abortos espontneos ocurre durante los primeros 34meses de Media planner. A veces, un aborto ocurre antes de que la mujer sepa que est [place]. El aborto espontneo puede ser Ardelia Mems experiencia que afecte emocionalmente a Geologist, engineering. Si ha sufrido un aborto espontneo, hable con su mdico y hgale las preguntas que tenga sobre el aborto espontneo, el proceso de duelo y los planes futuros de [place]. Cules son las causas? [place] un aborto espontneo se incluyen las siguientes:  Problemas genticos o cromosmicos del feto. Estos problemas impiden que el beb se desarrolle con normalidad. En general, son el resultado de errores fortuitos que ocurren en la etapa temprana del desarrollo y que no se transmiten de padres a hijos (no se heredan).  Infeccin en el cuello del tero.  Trastornos que afectan el equilibrio hormonal del organismo.  Problemas en el cuello del tero, como su adelgazamiento y apertura antes de que el embarazo llegue a trmino (insuficiencia del cuello de tero).  Problemas en el tero. Estos pueden incluir, [employer], los siguientes: ? Forma anormal del tero. ? Fibromas en el tero. ? Anormalidades congnitas. Estos son problemas que ya estaban presentes en el nacimiento.  Ciertas enfermedades crnicas.  Fumar, beber alcohol o usar drogas.  Lesiones (traumatismos). En muchos de [employer], se desconoce la causa de los abortos espontneos. Cules son los signos o los sntomas? Los sntomas de esta afeccin incluyen los siguientes:  Sangrado o manchado vaginal, con o sin clicos o dolor.  Dolor o clicos en el abdomen o en la parte inferior de la espalda.  Eliminacin de lquido, tejidos o cogulos sanguneos por la vagina. Cmo se diagnostica? Esta afeccin se puede diagnosticar en funcin de  lo siguiente:  Examen fsico.  Ecografa.  Anlisis de [place].  Anlisis de [place]. Cmo se trata? En algunos casos, el tratamiento de un aborto espontneo no es necesario si se eliminan de forma natural todos los tejidos que se Health visitor. Si fuera necesario realizar un tratamiento por esta afeccin, este puede incluir lo siguiente:  Dilatacin y curetaje (D&C). Mediante este procedimiento, se expande el cuello del tero y se raspan las paredes (endometrio). Esto se realiza solamente si queda tejido del feto o la placenta dentro del cuerpo (aborto espontneo incompleto).  Medicamentos, por ejemplo: ? Antibiticos para tratar una infeccin. ? Medicamentos para ayudar al cuerpo a eliminar los restos de tejido. ? Medicamentos para reducir Occupational psychologist) el tamao del tero. Estos medicamentos se pueden administrar si tiene un sangrado abundante. Si su factor sanguneo es Rhnegativo y [place] de su beb es Rhpositivo, usted Print production planner inyeccin del medicamento llamado inmunoglobulinaRhpara proteger a los bebs futuros de Best boy problemas con el factorsanguneoRh. Los trminos "Rhnegativo" y "Rhpositivo" hacen referencia a la presencia o no en la sangre de una protena especfica que se encuentra en la superficie de los glbulos rojos (factor Rh). Siga estas indicaciones en su casa: Medicamentos   [employer] de venta libre y los recetados solamente como se lo haya indicado el mdico.  Si le recetaron antibiticos, tmelos como se lo haya indicado el mdico. No deje de tomar los antibiticos aunque comience a Sports administrator.  No tome antiinflamatorios no esteroideos (AINE), tales como aspirina e ibuprofeno, a menos que se lo indique el mdico. Estos medicamentos pueden provocarle sangrado. [employer]  reposo segn lo indicado. Pregntele al mdico qu actividades son seguras para usted.  Pdale a alguien que la ayude con las responsabilidades familiares y del  hogar durante este tiempo. Instrucciones generales  Lleve un registro de la cantidad y la saturacin de las toallas higinicas que Medical laboratory scientific officer. Anote esta informacin.  Anote la cantidad de tejido o cogulos sanguneos que expulsa por la vagina. Guarde las cantidades grandes de tejidos para que el [employer] examine.  No use tampones, no se haga duchas vaginales ni tenga relaciones sexuales hasta que el mdico la autorice.  Para que usted y su pareja puedan sobrellevar el proceso del duelo, hable con su mdico o busque apoyo psicolgico.  Cuando est lista, visite a su mdico para hablar sobre los pasos importantes que deber seguir en relacin con su salud. Tambin hable [employer] que deber tomar para tener un embarazo saludable en el futuro.  Concurra a todas las visitas de seguimiento como se lo haya indicado el mdico. Esto es importante. Dnde encontrar ms informacin  Colegio Estadounidense de [place] y Careers information officer of Obstetricians and Gynecologists): www.acog.org  Departamento de Salud y [place], [place] (U.S. Department of Health and [employer], Office on [employer]): [email or web address] [place] con un mdico si:  Jaclynn Guarneri o siente escalofros.  Tiene una secrecin vaginal con mal olor.  El sangrado aumenta en vez de disminuir. Solicite ayuda de inmediato si:  Siente calambres intensos o dolor en la espalda o en el abdomen.  Elimina cogulos de sangre o tejido por la vagina del tamao de una nuez o ms grandes.  Necesita ms de una toalla higinica de tamao regular por hora.  Se siente mareada o dbil.  Se desmaya.  Siente una tristeza que la invade o Interior and spatial designer. Resumen  La mayor parte de los abortos espontneos ocurre durante los primeros 17meses de Media planner. En algunos casos, el aborto espontneo ocurre antes de que la mujer sepa que est  [place].  Siga las indicaciones del mdico para el cuidado Financial planner. Concurra a todas las visitas de control.  Para que usted y su pareja puedan sobrellevar el proceso del duelo, hable con su mdico o busque apoyo psicolgico. Esta informacin no tiene Marine scientist el consejo del mdico. Asegrese de hacerle al mdico cualquier pregunta que tenga. Document Revised: 07/13/2017 Document Reviewed: 07/13/2017 Elsevier Patient Education  2020 [employer].

## 2020-01-14 NOTE — MAU Provider Note (Signed)
Chief Complaint: Abdominal Pain   First Provider Initiated Contact with Patient 01/14/20 1215        SUBJECTIVE HPI: Samantha Conner is a 34 y.o. G1P0 at [redacted]w[redacted]d by LMP who presents to maternity admissions reporting pain from cramping with inevitable SAB.  Was seen here on 01/08/20 and Sac was mobile with FHR slow.  She elected expectant management but states did not have any medicine for pain at home.  States bleeding has decreased. . She denies vaginal itching/burning, urinary symptoms, h/a, dizziness, n/v, or fever/chills.    Abdominal Pain This is a recurrent problem. The current episode started in the past 7 days. The onset quality is gradual. The problem occurs intermittently. The problem has been unchanged. The pain is located in the suprapubic region. The pain is severe. The quality of the pain is cramping. The abdominal pain does not radiate. Pertinent negatives include no constipation, diarrhea, dysuria, fever, frequency, myalgias, nausea or vomiting. Nothing aggravates the pain. The pain is relieved by nothing. She has tried nothing for the symptoms.   RN Note: Samantha Conner is a 34 y.o. at [redacted]w[redacted]d here in MAU reporting:  Reports that she was here Sunday and told she had a miscarriage.  Today started having a lot of lower abdominal pain bilaterally.  Pain score: 9/10. Has not taken any medication for the pain. Still has ongoing bleeding. "a little bit" having nickle size clots.  Past Medical History:  Diagnosis Date  . Medical history non-contributory    Past Surgical History:  Procedure Laterality Date  . NO PAST SURGERIES     Social History   Socioeconomic History  . Marital status: Married    Spouse name: Not on file  . Number of children: Not on file  . Years of education: Not on file  . Highest education level: Not on file  Occupational History  . Not on file  Tobacco Use  . Smoking status: Never Smoker  . Smokeless tobacco: Never Used  Substance and Sexual  Activity  . Alcohol use: Never  . Drug use: Never  . Sexual activity: Yes    Birth control/protection: None  Other Topics Concern  . Not on file  Social History Narrative  . Not on file   Social Determinants of Health   Financial Resource Strain:   . Difficulty of Paying Living Expenses:   Food Insecurity:   . Worried About Charity fundraiser in the Last Year:   . Arboriculturist in the Last Year:   Transportation Needs:   . Film/video editor (Medical):   Marland Kitchen Lack of Transportation (Non-Medical):   Physical Activity:   . Days of Exercise per Week:   . Minutes of Exercise per Session:   Stress:   . Feeling of Stress :   Social Connections:   . Frequency of Communication with Friends and Family:   . Frequency of Social Gatherings with Friends and Family:   . Attends Religious Services:   . Active Member of Clubs or Organizations:   . Attends Archivist Meetings:   Marland Kitchen Marital Status:   Intimate Partner Violence:   . Fear of Current or Ex-Partner:   . Emotionally Abused:   Marland Kitchen Physically Abused:   . Sexually Abused:    No current facility-administered medications on file prior to encounter.   Current Outpatient Medications on File Prior to Encounter  Medication Sig Dispense Refill  . metroNIDAZOLE (FLAGYL) 500 MG tablet Take 1 tablet (  500 mg total) by mouth 2 (two) times daily for 7 days. 14 tablet 0   No Known Allergies  I have reviewed patient's Past Medical Hx, Surgical Hx, Family Hx, Social Hx, medications and allergies.   ROS:  Review of Systems  Constitutional: Negative for fever.  Gastrointestinal: Positive for abdominal pain. Negative for constipation, diarrhea, nausea and vomiting.  Genitourinary: Negative for dysuria and frequency.  Musculoskeletal: Negative for myalgias.   Review of Systems  Other systems negative   Physical Exam  Physical Exam Patient Vitals for the past 24 hrs:  BP Temp Temp src Pulse Resp SpO2  01/14/20 1211 119/88  98.3 F (36.8 C) Oral 75 16 100 %   Constitutional: Well-developed, well-nourished female in no acute distress.  Cardiovascular: normal rate Respiratory: normal effort GI: Abd soft, non-tender. Pos BS x 4 MS: Extremities nontender, no edema, normal ROM Neurologic: Alert and oriented x 4.  GU: Neg CVAT.  PELVIC EXAM: scant bleeding Bimanual exam: Cervix 0/long/high, firm, anterior, neg CMT, uterus slightly tender, nonenlarged, adnexa without tenderness, enlargement, or mass   LAB RESULTS No results found for this or any previous visit (from the past 24 hour(s)).   --/--/O POS (03/22 0003)  IMAGING US OB Transvaginal  Result Date: 01/14/2020 CLINICAL DATA:  Assess for completion of spontaneous abortion. The patient had confirmation of intrauterine pregnancy on 01/09/2020. EXAM: TRANSVAGINAL OB ULTRASOUND TECHNIQUE: Transvaginal ultrasound was performed for complete evaluation of the gestation as well as the maternal uterus, adnexal regions, and pelvic cul-de-sac. COMPARISON:  Transvaginal ultrasound dated 01/09/2020. FINDINGS: Intrauterine gestational sac: None Yolk sac:  Not Visualized. Embryo:  Not Visualized. Cardiac Activity: Not Visualized. Subchorionic hemorrhage:  None visualized. Maternal uterus/adnexae: Normal bilateral ovaries. The endometrium measures 4 mm. No retained products of conception are noted. A mass in the uterine fundus measures 2.2 x 2.0 x 1.7 cm and likely represents a fibroid. IMPRESSION: No visible intrauterine gestational sac or retained products of conception, consistent with a complete interval spontaneous abortion. These results were called by telephone at the time of interpretation on 01/14/2020 at 2:13 pm to provider Hansel Feinstein , who verbally acknowledged these results. Electronically Signed   By: Zerita Boers M.D.   On: 01/14/2020 14:13     MAU Management/MDM: Ordered followup Ultrasound to rule out ectopic.  This bleeding/pain can represent a normal  pregnancy with bleeding, spontaneous abortion or even an ectopic which can be life-threatening.  The process as listed above helps to determine which of these is present.  Reviewed US findings with pt and husband using interpretor Discussed pain control Will have her follow up in clinic   ASSESSMENT Pregnancy at [redacted]w[redacted]d Completed SAB  PLAN Discharge home Rx ibuprofen for mild pain Rx Percocet #6tab for severe pain Pt stable at time of discharge. Encouraged to return here or to other Urgent Care/ED if she develops worsening of symptoms, increase in pain, fever, or other concerning symptoms.    Hansel Feinstein CNM, MSN Certified Nurse-Midwife 01/14/2020  12:15 PM

## 2020-01-14 NOTE — MAU Note (Signed)
Samantha Conner is a 34 y.o. at [redacted]w[redacted]d here in MAU reporting:  Reports that she was here Sunday and told she had a miscarriage.  Today started having a lot of lower abdominal pain bilaterally.  Pain score: 9/10. Has not taken any medication for the pain.  Still has ongoing bleeding. "a little bit" having nickle size clots. Vitals:   01/14/20 1211  BP: 119/88  Pulse: 75  Resp: 16  Temp: 98.3 F (36.8 C)  SpO2: 100%     Lab orders placed from triage: none  In person [facility] interpreter used for this encounter: Arbie Cookey

## 2020-01-16 ENCOUNTER — Other Ambulatory Visit: Payer: Self-pay | Admitting: General Practice

## 2020-01-16 ENCOUNTER — Other Ambulatory Visit: Payer: Self-pay

## 2020-01-16 DIAGNOSIS — O039 Complete or unspecified spontaneous abortion without complication: Secondary | ICD-10-CM

## 2020-01-30 ENCOUNTER — Ambulatory Visit: Payer: Self-pay | Admitting: Student

## 2020-06-11 LAB — OB RESULTS CONSOLE GC/CHLAMYDIA
Chlamydia: NEGATIVE
Gonorrhea: NEGATIVE

## 2020-06-11 LAB — OB RESULTS CONSOLE HEPATITIS B SURFACE ANTIGEN: Hepatitis B Surface Ag: NEGATIVE

## 2020-06-11 LAB — OB RESULTS CONSOLE ANTIBODY SCREEN: Antibody Screen: NEGATIVE

## 2020-06-11 LAB — GLUCOSE TOLERANCE, 1 HOUR: Glucose, GTT - 1 Hour: 178 (ref ?–200)

## 2020-06-11 LAB — OB RESULTS CONSOLE RUBELLA ANTIBODY, IGM: Rubella: IMMUNE

## 2020-06-11 LAB — OB RESULTS CONSOLE HIV ANTIBODY (ROUTINE TESTING): HIV: NONREACTIVE

## 2020-06-11 LAB — OB RESULTS CONSOLE ABO/RH: RH Type: POSITIVE

## 2020-06-11 LAB — CYTOLOGY - PAP: Pap: NEGATIVE

## 2020-06-11 LAB — OB RESULTS CONSOLE RPR: RPR: NONREACTIVE

## 2020-06-11 LAB — OB RESULTS CONSOLE VARICELLA ZOSTER ANTIBODY, IGG: Varicella: IMMUNE

## 2020-06-11 LAB — OB RESULTS CONSOLE HGB/HCT, BLOOD
HCT: 36 (ref 29–41)
Hemoglobin: 11.8

## 2020-06-11 LAB — DRUG SCREEN, URINE: Drug Screen, Urine: NEGATIVE

## 2020-06-11 LAB — HEPATITIS C ANTIBODY: HCV Ab: NEGATIVE

## 2020-06-11 LAB — CULTURE, OB URINE: Urine Culture, OB: NEGATIVE

## 2020-06-11 LAB — HEMOGLOBIN EVAL RFX ELECTROPHORESIS: Hemoglobin Evaluation: NORMAL

## 2020-06-11 LAB — OB RESULTS CONSOLE PLATELET COUNT: Platelets: 192

## 2020-06-11 LAB — SICKLE CELL SCREEN: Sickle Cell Screen: NORMAL

## 2020-06-13 LAB — GLUCOSE TOLERANCE, 3 HOURS
Glucose, GTT - 1 Hour: 195 (ref ?–200)
Glucose, GTT - 2 Hour: 202 — AB (ref ?–140)
Glucose, GTT - 3 Hour: 150 mg/dL — AB (ref ?–140)
Glucose, GTT - Fasting: 91 mg/dL (ref 80–110)

## 2020-06-19 ENCOUNTER — Encounter: Payer: Self-pay | Admitting: *Deleted

## 2020-06-19 DIAGNOSIS — O0990 Supervision of high risk pregnancy, unspecified, unspecified trimester: Secondary | ICD-10-CM | POA: Insufficient documentation

## 2020-06-21 ENCOUNTER — Encounter: Payer: Self-pay | Admitting: *Deleted

## 2020-06-28 ENCOUNTER — Other Ambulatory Visit: Payer: Self-pay

## 2020-06-28 ENCOUNTER — Ambulatory Visit: Payer: Self-pay | Admitting: Registered"

## 2020-06-28 ENCOUNTER — Encounter: Payer: Self-pay | Attending: Obstetrics & Gynecology | Admitting: Registered"

## 2020-06-28 DIAGNOSIS — O24410 Gestational diabetes mellitus in pregnancy, diet controlled: Secondary | ICD-10-CM | POA: Insufficient documentation

## 2020-06-28 DIAGNOSIS — Z3A00 Weeks of gestation of pregnancy not specified: Secondary | ICD-10-CM | POA: Insufficient documentation

## 2020-06-28 DIAGNOSIS — O24419 Gestational diabetes mellitus in pregnancy, unspecified control: Secondary | ICD-10-CM | POA: Insufficient documentation

## 2020-06-28 NOTE — Progress Notes (Signed)
Interpreter services provided by Marinus Maw #[id number] & Donald Prose [phone] from [place] video   Patient was seen on 06/28/20 for Gestational Diabetes self-management 2nd trimester. EDD 12/14/20. Patient states no history of GDM. Diet history obtained. Patient eats variety of all food groups. Beverages include water.    This patient is accompanied in the office by her spouse. Patient's spouse can speak and understand some [place].  Education time was limited due to language barrier, technical difficulties with iPad interpreter, and time taken to explain food resources due to answers on food insecurity questionnaire.  The following learning objectives were met by the patient :   Describes the effects of carbohydrates on blood glucose levels  Demonstrates carbohydrate counting   States when to check blood glucose levels  Demonstrates proper blood glucose monitoring techniques  Plan:  Aim for 3 Carbohydrate Choices per meal (45 grams) +/- 1 either way  Aim for 1-2 Carbohydrate Choices per snack Begin reading food labels for Total Carbohydrate of foods If OK with your MD, consider  increasing your activity level by walking, Arm Chair Exercises or other activity daily as tolerated Begin checking Blood Glucose before breakfast and 2 hours after first bite of breakfast, lunch and dinner as directed by MD  Bring Log Book/Sheet and meter to every medical appointment  Baby Scripts: Patient not appropriate for Baby Scripts due to language barrier  Take medication if directed by MD  Blood glucose monitor given: [employer] Lot # [id number] Blood glucose reading: 90 mg/dL  Patient instructed to monitor glucose levels: FBS: 60 - 95 mg/dl 2 hour: <120 mg/dl  Patient received the following handouts:  Nutrition Diabetes and Pregnancy  Carbohydrate Counting List  Blood glucose Log Sheet  WIC fresh food/farmers markets program  Patient will be seen for follow-up in 3-4 weeks or as needed.

## 2020-07-04 ENCOUNTER — Other Ambulatory Visit: Payer: Self-pay

## 2020-07-04 ENCOUNTER — Other Ambulatory Visit: Payer: Self-pay | Admitting: *Deleted

## 2020-07-04 DIAGNOSIS — O24419 Gestational diabetes mellitus in pregnancy, unspecified control: Secondary | ICD-10-CM

## 2020-07-06 ENCOUNTER — Ambulatory Visit (INDEPENDENT_AMBULATORY_CARE_PROVIDER_SITE_OTHER): Payer: Self-pay | Admitting: Obstetrics and Gynecology

## 2020-07-06 ENCOUNTER — Other Ambulatory Visit: Payer: Self-pay

## 2020-07-06 VITALS — BP 115/78 | HR 80 | Wt 141.0 lb

## 2020-07-06 DIAGNOSIS — O24410 Gestational diabetes mellitus in pregnancy, diet controlled: Secondary | ICD-10-CM

## 2020-07-06 DIAGNOSIS — Z3A17 17 weeks gestation of pregnancy: Secondary | ICD-10-CM

## 2020-07-06 DIAGNOSIS — Z789 Other specified health status: Secondary | ICD-10-CM | POA: Insufficient documentation

## 2020-07-06 DIAGNOSIS — O0990 Supervision of high risk pregnancy, unspecified, unspecified trimester: Secondary | ICD-10-CM

## 2020-07-06 LAB — POCT URINALYSIS DIP (DEVICE)
Bilirubin Urine: NEGATIVE
Glucose, UA: NEGATIVE mg/dL
Hgb urine dipstick: NEGATIVE
Ketones, ur: NEGATIVE mg/dL
Leukocytes,Ua: NEGATIVE
Nitrite: NEGATIVE
Protein, ur: NEGATIVE mg/dL
Specific Gravity, Urine: >=1.03 (ref 1.005–1.030)
Urobilinogen, UA: 0.2 mg/dL (ref 0.0–1.0)
pH: 6.5 (ref 5.0–8.0)

## 2020-07-06 MED ORDER — ASPIRIN EC 81 MG PO TBEC: 81.0000 mg | DELAYED_RELEASE_TABLET | Freq: Every day | ORAL | 10 refills | Status: DC

## 2020-07-06 NOTE — Progress Notes (Signed)
Prenatal Visit Note Date: 07/06/2020 Clinic: [facility]  Transfer of care visit for GDM  Subjective:  Samantha Conner is a 34 y.o. G2P0010 at [redacted]w[redacted]d being seen today for ongoing prenatal care.  She is currently monitored for the following issues for this high-risk pregnancy and has Supervision of high risk pregnancy, antepartum; Gestational diabetes mellitus (GDM), antepartum; and Language barrier on their problem list.  Patient reports no complaints.   Contractions: Not present.  .  Movement: Absent. Denies leaking of fluid.   The following portions of the patient's history were reviewed and updated as appropriate: allergies, current medications, past family history, past medical history, past social history, past surgical history and problem list. Problem list updated.  Objective:   Vitals:   07/06/20 0921  BP: 115/78  Pulse: 80  Weight: 141 lb (64 kg)    Fetal Status: Fetal Heart Rate (bpm): 145   Movement: Absent     General:  Alert, oriented and cooperative. Patient is in no acute distress.  Skin: Skin is warm and dry. No rash noted.   Cardiovascular: Normal heart rate noted  Respiratory: Normal respiratory effort, no problems with respiration noted  Abdomen: Soft, gravid, appropriate for gestational age. Pain/Pressure: Absent     Pelvic:  Cervical exam deferred        Extremities: Normal range of motion.  Edema: Trace  Mental Status: Normal mood and affect. Normal behavior. Normal judgment and thought content.   Urinalysis:      Assessment and Plan:  Pregnancy: G2P0010 at [redacted]w[redacted]d  1. Supervision of high risk pregnancy, antepartum Routine care. Pt amenable to afp. She declines genetics. I told her I recommend mfm anatomy u/s since early gdm dx and if normal then can have any growth u/s at HD. Pt is self pay. Pt amenable to starting low dose asa. Baseline high risk labs today HD labs reviewed. Pt has never had u/s in the past. Per HD she is O pos and  antibody neg. Will fax over results. Rest of lab work negative.  - [place] MFM OB DETAIL +14 WK; Future - Protein / creatinine ratio, urine - Comprehensive metabolic panel - AFP, Serum, Open Spina Bifida - TSH -A1c  2. Language barrier Interpreter used  3. Diet controlled gestational diabetes mellitus (GDM), antepartum Normal log  Preterm labor symptoms and general obstetric precautions including but not limited to vaginal bleeding, contractions, leaking of fluid and fetal movement were reviewed in detail with the patient. Please refer to After Visit Summary for other counseling recommendations.  RTC: 3wks for anatomy u/s and hrob   Aletha Halim, MD

## 2020-07-07 LAB — PROTEIN / CREATININE RATIO, URINE
Creatinine, Urine: 203.5 mg/dL
Protein, Ur: 14.9 mg/dL
Protein/Creat Ratio: 73 mg/g creat (ref 0–200)

## 2020-07-08 LAB — AFP, SERUM, OPEN SPINA BIFIDA
AFP MoM: 0.86
AFP Value: 34.9 ng/mL
Gest. Age on Collection Date: 17 weeks
Maternal Age At EDD: 33 yr
OSBR Risk 1 IN: 10000
Test Results:: NEGATIVE
Weight: 142 [lb_av]

## 2020-07-08 LAB — COMPREHENSIVE METABOLIC PANEL
ALT: 11 IU/L (ref 0–32)
AST: 10 IU/L (ref 0–40)
Albumin/Globulin Ratio: 1.5 (ref 1.2–2.2)
Albumin: 4.1 g/dL (ref 3.8–4.8)
Alkaline Phosphatase: 67 IU/L (ref 44–121)
BUN/Creatinine Ratio: 16 (ref 9–23)
BUN: 9 mg/dL (ref 6–20)
Bilirubin Total: 0.5 mg/dL (ref 0.0–1.2)
CO2: 19 mmol/L — ABNORMAL LOW (ref 20–29)
Calcium: 8.9 mg/dL (ref 8.7–10.2)
Chloride: 102 mmol/L (ref 96–106)
Creatinine, Ser: 0.58 mg/dL (ref 0.57–1.00)
GFR calc Af Amer: 141 mL/min/{1.73_m2} (ref >59)
GFR calc non Af Amer: 122 mL/min/{1.73_m2} (ref >59)
Globulin, Total: 2.7 g/dL (ref 1.5–4.5)
Glucose: 79 mg/dL (ref 65–99)
Potassium: 4 mmol/L (ref 3.5–5.2)
Sodium: 136 mmol/L (ref 134–144)
Total Protein: 6.8 g/dL (ref 6.0–8.5)

## 2020-07-08 LAB — HEMOGLOBIN A1C
Est. average glucose Bld gHb Est-mCnc: 108 mg/dL
Hgb A1c MFr Bld: 5.4 % (ref 4.8–5.6)

## 2020-07-08 LAB — TSH: TSH: 2.3 u[IU]/mL (ref 0.450–4.500)

## 2020-07-12 ENCOUNTER — Encounter: Payer: Self-pay | Admitting: *Deleted

## 2020-07-19 ENCOUNTER — Other Ambulatory Visit: Payer: Self-pay

## 2020-07-19 ENCOUNTER — Encounter: Payer: Self-pay | Admitting: Registered"

## 2020-07-19 ENCOUNTER — Ambulatory Visit: Payer: Self-pay | Admitting: Registered"

## 2020-07-19 DIAGNOSIS — O24419 Gestational diabetes mellitus in pregnancy, unspecified control: Secondary | ICD-10-CM

## 2020-07-19 NOTE — Progress Notes (Signed)
Interpreter services provided by Peter [place] [phone] from [employer] video  Patient was seen on 07/19/20 for follow-up assessment and education for Gestational Diabetes. EDD 12/14/20.  Patient is testing blood glucose as directed pre breakfast and 2 hours after each meal. Review of Babyscripts shows CBGs mostly WNL.   Pt reports that the few elevated postprandial were due to larger meals and 5-6 tortillas which is more than she usually eats.  The following learning objectives reviewed during follow-up visit: Carb counting  Appropriate servings for meals/snacks Changes that may occur in blood sugar control in 3rd trimester  Plan:  . Continue to check blood sugar and enter in Babyscripts . Continue to pay attention to foods that have carbohydrates and keep to around 3 servings  RD provided more supplies for checking blood sugar. Patient indicated again food insecurity. Pt states she did not follow-up with the [facility] farmer's market resource. [facility] Program has not started yet, but informed patient that soon we will have a food resource at this location.  Patient instructed to monitor glucose levels: FBS: 60 - 95 mg/dl 2 hour: <120 mg/dl  Patient received the following handouts:  Green carb counting sheet  Patient will be seen for follow-up as needed.

## 2020-07-23 ENCOUNTER — Other Ambulatory Visit: Payer: Self-pay

## 2020-07-23 ENCOUNTER — Other Ambulatory Visit: Payer: Self-pay | Admitting: *Deleted

## 2020-07-23 ENCOUNTER — Ambulatory Visit: Payer: Self-pay | Admitting: *Deleted

## 2020-07-23 ENCOUNTER — Encounter: Payer: Self-pay | Admitting: *Deleted

## 2020-07-23 ENCOUNTER — Ambulatory Visit: Payer: Self-pay | Attending: Obstetrics and Gynecology

## 2020-07-23 DIAGNOSIS — O24410 Gestational diabetes mellitus in pregnancy, diet controlled: Secondary | ICD-10-CM

## 2020-07-23 DIAGNOSIS — O0990 Supervision of high risk pregnancy, unspecified, unspecified trimester: Secondary | ICD-10-CM

## 2020-07-27 ENCOUNTER — Other Ambulatory Visit: Payer: Self-pay

## 2020-07-27 ENCOUNTER — Ambulatory Visit (INDEPENDENT_AMBULATORY_CARE_PROVIDER_SITE_OTHER): Payer: Self-pay | Admitting: Obstetrics and Gynecology

## 2020-07-27 ENCOUNTER — Encounter: Payer: Self-pay | Admitting: Obstetrics and Gynecology

## 2020-07-27 VITALS — BP 116/71 | HR 80 | Wt 140.6 lb

## 2020-07-27 DIAGNOSIS — Z789 Other specified health status: Secondary | ICD-10-CM

## 2020-07-27 DIAGNOSIS — O24410 Gestational diabetes mellitus in pregnancy, diet controlled: Secondary | ICD-10-CM

## 2020-07-27 DIAGNOSIS — O0990 Supervision of high risk pregnancy, unspecified, unspecified trimester: Secondary | ICD-10-CM

## 2020-07-27 DIAGNOSIS — Z23 Encounter for immunization: Secondary | ICD-10-CM

## 2020-07-27 NOTE — Progress Notes (Signed)
Subjective:  Samantha Conner is a 34 y.o. G2P0010 at [redacted]w[redacted]d being seen today for ongoing prenatal care.  She is currently monitored for the following issues for this high-risk pregnancy and has Supervision of high risk pregnancy, antepartum; Gestational diabetes mellitus (GDM), antepartum; and Language barrier on their problem list.  Patient reports no complaints.   .  .   . Denies leaking of fluid.   The following portions of the patient's history were reviewed and updated as appropriate: allergies, current medications, past family history, past medical history, past social history, past surgical history and problem list. Problem list updated.  Objective:   Vitals:   07/27/20 1058  BP: 116/71  Pulse: 80  Weight: 140 lb 9.6 oz (63.8 kg)    Fetal Status: Fetal Heart Rate (bpm): 159         General:  Alert, oriented and cooperative. Patient is in no acute distress.  Skin: Skin is warm and dry. No rash noted.   Cardiovascular: Normal heart rate noted  Respiratory: Normal respiratory effort, no problems with respiration noted  Abdomen: Soft, gravid, appropriate for gestational age.       Pelvic:  Cervical exam deferred        Extremities: Normal range of motion.     Mental Status: Normal mood and affect. Normal behavior. Normal judgment and thought content.   Urinalysis:      Assessment and Plan:  Pregnancy: G2P0010 at [redacted]w[redacted]d  1. Supervision of high risk pregnancy, antepartum Stable  2. Diet controlled gestational diabetes mellitus (GDM), antepartum CBG's in goal range Continue with diet Growth scan 07/03/20  3. Language barrier Live interrupter used during today's visit  Preterm labor symptoms and general obstetric precautions including but not limited to vaginal bleeding, contractions, leaking of fluid and fetal movement were reviewed in detail with the patient. Please refer to After Visit Summary for other counseling recommendations.  Return in about 1 week (around  08/03/2020) for OB visit, face to face, MD only.   Chancy Milroy, MD

## 2020-07-27 NOTE — Addendum Note (Signed)
Addended by: Bethanne Ginger on: 07/27/2020 11:32 AM   Modules accepted: Orders

## 2020-07-27 NOTE — Patient Instructions (Signed)

## 2020-07-30 ENCOUNTER — Ambulatory Visit: Payer: Self-pay | Attending: Obstetrics and Gynecology

## 2020-07-30 ENCOUNTER — Other Ambulatory Visit: Payer: Self-pay

## 2020-07-30 ENCOUNTER — Ambulatory Visit: Payer: Self-pay

## 2020-07-30 ENCOUNTER — Encounter: Payer: Self-pay | Admitting: *Deleted

## 2020-07-30 ENCOUNTER — Other Ambulatory Visit: Payer: Self-pay | Admitting: *Deleted

## 2020-07-30 DIAGNOSIS — W19XXXA Unspecified fall, initial encounter: Secondary | ICD-10-CM

## 2020-07-30 DIAGNOSIS — O368390 Maternal care for abnormalities of the fetal heart rate or rhythm, unspecified trimester, not applicable or unspecified: Secondary | ICD-10-CM | POA: Insufficient documentation

## 2020-07-30 DIAGNOSIS — W182XXA Fall in (into) shower or empty bathtub, initial encounter: Secondary | ICD-10-CM | POA: Insufficient documentation

## 2020-07-30 DIAGNOSIS — O24419 Gestational diabetes mellitus in pregnancy, unspecified control: Secondary | ICD-10-CM | POA: Insufficient documentation

## 2020-07-30 DIAGNOSIS — Y92002 Bathroom of unspecified non-institutional (private) residence single-family (private) house as the place of occurrence of the external cause: Secondary | ICD-10-CM | POA: Insufficient documentation

## 2020-07-30 DIAGNOSIS — Z362 Encounter for other antenatal screening follow-up: Secondary | ICD-10-CM

## 2020-07-30 DIAGNOSIS — O368392 Maternal care for abnormalities of the fetal heart rate or rhythm, unspecified trimester, fetus 2: Secondary | ICD-10-CM

## 2020-07-30 DIAGNOSIS — O24410 Gestational diabetes mellitus in pregnancy, diet controlled: Secondary | ICD-10-CM

## 2020-07-30 DIAGNOSIS — Z3A20 20 weeks gestation of pregnancy: Secondary | ICD-10-CM | POA: Insufficient documentation

## 2020-07-30 DIAGNOSIS — Y93E1 Activity, personal bathing and showering: Secondary | ICD-10-CM | POA: Insufficient documentation

## 2020-07-30 NOTE — Progress Notes (Signed)
Samantha Conner came to desk and reported she fell over the weekend and wanted to check on baby.  Used [employer] video interpreter [place] [id number].  Samantha Conner reports she fell when she was taking a shower in the bathtub.  Denies her belly hitting anything, States she fell backwards and caught herself with her arms and fell on her back flat.   FHR baseline 150 with irregular rhythmn noted by nurse.  Rate range 130-155. Denies any vaginal bleeding.  States felt baby move more than usual after she fell. BP 112 74 P 82 wight 137.8lb. Discussed patient fall and details as above and assessment with Dr. Ilda Basset. Also reported FHR irregular with range 130-155 . Limited US ordered for within next few days to look at heart.   Called MFM to schedule and explained needs to be this week by Wednesday if possible per Dr. Ilda Basset orders.  Scheduled for today as add on. Sent to MFM for [place].   Dr. Ilda Basset also advised patient put down shower mat or stickies to prevent another fall. Informed patient of recommendation and she voices understanding.  Also advised patient if she has vaginal bleeding or decreased fetal movement needs to be reevaluated. She voices understanding. Samantha Ferryman,RN

## 2020-08-07 ENCOUNTER — Encounter: Payer: Self-pay | Admitting: Student

## 2020-08-14 ENCOUNTER — Other Ambulatory Visit: Payer: Self-pay

## 2020-08-14 ENCOUNTER — Ambulatory Visit (INDEPENDENT_AMBULATORY_CARE_PROVIDER_SITE_OTHER): Payer: Self-pay | Admitting: Family Medicine

## 2020-08-14 VITALS — BP 105/65 | HR 77 | Wt 140.1 lb

## 2020-08-14 DIAGNOSIS — Z789 Other specified health status: Secondary | ICD-10-CM

## 2020-08-14 DIAGNOSIS — Z3A22 22 weeks gestation of pregnancy: Secondary | ICD-10-CM

## 2020-08-14 DIAGNOSIS — O24410 Gestational diabetes mellitus in pregnancy, diet controlled: Secondary | ICD-10-CM

## 2020-08-14 DIAGNOSIS — O0990 Supervision of high risk pregnancy, unspecified, unspecified trimester: Secondary | ICD-10-CM

## 2020-08-14 NOTE — Progress Notes (Signed)
Spanish Interpreter [place]

## 2020-08-14 NOTE — Progress Notes (Signed)
Subjective:  Samantha Conner is a 34 y.o. G2P0010 at [redacted]w[redacted]d being seen today for ongoing prenatal care.  She is currently monitored for the following issues for this high-risk pregnancy and has Supervision of high risk pregnancy, antepartum; Gestational diabetes mellitus (GDM), antepartum; and Language barrier on their problem list.  GDM: Patient diet controlled.   Fasting: 78-95 2hr PP: 90-120  Patient reports no complaints.  Contractions: Not present. Vag. Bleeding: None.  Movement: Present. Denies leaking of fluid.   The following portions of the patient's history were reviewed and updated as appropriate: allergies, current medications, past family history, past medical history, past social history, past surgical history and problem list. Problem list updated.  Objective:   Vitals:   08/14/20 1053  BP: 105/65  Pulse: 77  Weight: 140 lb 1.6 oz (63.5 kg)    Fetal Status: Fetal Heart Rate (bpm): 146   Movement: Present     General:  Alert, oriented and cooperative. Patient is in no acute distress.  Skin: Skin is warm and dry. No rash noted.   Cardiovascular: Normal heart rate noted  Respiratory: Normal respiratory effort, no problems with respiration noted  Abdomen: Soft, gravid, appropriate for gestational age. Pain/Pressure: Absent     Pelvic: Vag. Bleeding: None     Cervical exam deferred        Extremities: Normal range of motion.  Edema: None  Mental Status: Normal mood and affect. Normal behavior. Normal judgment and thought content.   Urinalysis:      Assessment and Plan:  Pregnancy: G2P0010 at [redacted]w[redacted]d  1. Supervision of high risk pregnancy, antepartum FHT and FH normal  2. [redacted] weeks gestation of pregnancy  3. Diet controlled gestational diabetes mellitus (GDM), antepartum Controlled Serial [place] for growth   4. Language barrier Interpreter used.  Preterm labor symptoms and general obstetric precautions including but not limited to vaginal bleeding, contractions,  leaking of fluid and fetal movement were reviewed in detail with the patient. Please refer to After Visit Summary for other counseling recommendations.  No follow-ups on file.   Truett Mainland, DO

## 2020-08-20 ENCOUNTER — Ambulatory Visit: Payer: Self-pay

## 2020-08-28 ENCOUNTER — Ambulatory Visit (INDEPENDENT_AMBULATORY_CARE_PROVIDER_SITE_OTHER): Payer: Self-pay | Admitting: Obstetrics and Gynecology

## 2020-08-28 ENCOUNTER — Encounter: Payer: Self-pay | Admitting: *Deleted

## 2020-08-28 ENCOUNTER — Other Ambulatory Visit: Payer: Self-pay

## 2020-08-28 ENCOUNTER — Ambulatory Visit: Payer: Self-pay | Attending: Obstetrics

## 2020-08-28 ENCOUNTER — Other Ambulatory Visit: Payer: Self-pay | Admitting: *Deleted

## 2020-08-28 ENCOUNTER — Ambulatory Visit: Payer: Self-pay | Admitting: *Deleted

## 2020-08-28 VITALS — BP 104/73 | HR 84 | Wt 139.5 lb

## 2020-08-28 DIAGNOSIS — O321XX0 Maternal care for breech presentation, not applicable or unspecified: Secondary | ICD-10-CM

## 2020-08-28 DIAGNOSIS — O24410 Gestational diabetes mellitus in pregnancy, diet controlled: Secondary | ICD-10-CM

## 2020-08-28 DIAGNOSIS — O132 Gestational [pregnancy-induced] hypertension without significant proteinuria, second trimester: Secondary | ICD-10-CM

## 2020-08-28 DIAGNOSIS — O0990 Supervision of high risk pregnancy, unspecified, unspecified trimester: Secondary | ICD-10-CM | POA: Insufficient documentation

## 2020-08-28 DIAGNOSIS — Z362 Encounter for other antenatal screening follow-up: Secondary | ICD-10-CM

## 2020-08-28 DIAGNOSIS — Z789 Other specified health status: Secondary | ICD-10-CM

## 2020-08-28 DIAGNOSIS — O368392 Maternal care for abnormalities of the fetal heart rate or rhythm, unspecified trimester, fetus 2: Secondary | ICD-10-CM

## 2020-08-28 DIAGNOSIS — Z3A24 24 weeks gestation of pregnancy: Secondary | ICD-10-CM

## 2020-08-28 NOTE — Progress Notes (Signed)
   PRENATAL VISIT NOTE  Subjective:  Samantha Conner is a 34 y.o. G2P0010 at [redacted]w[redacted]d being seen today for ongoing prenatal care.  She is currently monitored for the following issues for this high-risk pregnancy and has Supervision of high risk pregnancy, antepartum; Gestational diabetes mellitus (GDM), antepartum; and Language barrier on their problem list.  Patient has been testing her blood sugars four times daily. Needs testing strips today. She has a few fasting blood sugars that are 95, otherwise all other sugars are appropriate.   Patient reports no complaints.  Contractions: Not present. Vag. Bleeding: None.  Movement: Present. Denies leaking of fluid.   The following portions of the patient's history were reviewed and updated as appropriate: allergies, current medications, past family history, past medical history, past social history, past surgical history and problem list.   Objective:   Vitals:   08/28/20 0846  BP: 104/73  Pulse: 84  Weight: 139 lb 8 oz (63.3 kg)    Fetal Status: Fetal Heart Rate (bpm): 154   Movement: Present     General:  Alert, oriented and cooperative. Patient is in no acute distress.  Skin: Skin is warm and dry. No rash noted.   Cardiovascular: Normal heart rate noted  Respiratory: Normal respiratory effort, no problems with respiration noted  Abdomen: Soft, gravid, appropriate for gestational age.  Pain/Pressure: Absent     Pelvic: Cervical exam deferred        Extremities: Normal range of motion.  Edema: None  Mental Status: Normal mood and affect. Normal behavior. Normal judgment and thought content.   Assessment and Plan:  Pregnancy: G2P0010 at [redacted]w[redacted]d 1. Supervision of high risk pregnancy, antepartum Doing well overall.   2. Diet controlled gestational diabetes mellitus (GDM), antepartum Reviewed blood glucose log and is overall well controlled. Continue serial [place], has one today after appointment.   3. Language barrier In person interpreter  used   4. [redacted] weeks gestation of pregnancy   Preterm labor symptoms and general obstetric precautions including but not limited to vaginal bleeding, contractions, leaking of fluid and fetal movement were reviewed in detail with the patient. Please refer to After Visit Summary for other counseling recommendations.   No follow-ups on file.  Future Appointments  Date Time Provider [place]  08/28/2020  9:30 AM [facility] NURSE [facility]  08/28/2020  9:45 AM [facility] US5 [facility]    Janet Berlin, MD

## 2020-09-03 ENCOUNTER — Telehealth: Payer: Self-pay

## 2020-09-03 NOTE — Telephone Encounter (Signed)
Donnamae Jude, MD  P [facility] Clinical Pool If not using Babyscripts-for DM, please remove  Attempted to contact pt and left message to return call to the office.  Per chart review pt logs sugars on paper log.    Mel Almond, RN  09/03/20

## 2020-09-18 ENCOUNTER — Ambulatory Visit (INDEPENDENT_AMBULATORY_CARE_PROVIDER_SITE_OTHER): Payer: Self-pay | Admitting: Family Medicine

## 2020-09-18 ENCOUNTER — Encounter: Payer: Self-pay | Admitting: Family Medicine

## 2020-09-18 ENCOUNTER — Other Ambulatory Visit: Payer: Self-pay

## 2020-09-18 VITALS — BP 112/79 | HR 80 | Wt 140.9 lb

## 2020-09-18 DIAGNOSIS — O24410 Gestational diabetes mellitus in pregnancy, diet controlled: Secondary | ICD-10-CM

## 2020-09-18 DIAGNOSIS — O0990 Supervision of high risk pregnancy, unspecified, unspecified trimester: Secondary | ICD-10-CM

## 2020-09-18 DIAGNOSIS — Z23 Encounter for immunization: Secondary | ICD-10-CM

## 2020-09-18 DIAGNOSIS — Z789 Other specified health status: Secondary | ICD-10-CM

## 2020-09-18 NOTE — Patient Instructions (Signed)
Embarazo y cigarrillo Pregnancy and Smoking Fumar durante el embarazo es daino para usted y el beb. El humo de los cigarrillos, los Psychologist, sport and exercise, las pipas y los cigarros contiene muchas sustancias qumicas que pueden causar cncer (carcingenos). Estos productos tambin contienen un estimulante (nicotina). Cuando fuma, las sustancias dainas que inhala ingresan en su torrente sanguneo y pueden pasar al beb. Esto puede afectar el desarrollo del beb. Si planea quedar embarazada o est embarazada de poco tiempo, hable con el mdico sobre cmo dejar de fumar. Tiene muchas ms probabilidades de tener un embarazo saludable y un beb sano si no fuma mientras est embarazada. Cmo me afecta fumar? Fumar aumenta el riesgo de sufrir muchas enfermedades a largo plazo (crnicas). Estas enfermedades incluyen cncer, enfermedades pulmonares y enfermedades cardacas. Fumar durante el embarazo aumenta el riesgo de:  Perder el embarazo (aborto espontneo o muerte fetal).  Parto prematuro.  Embarazo fuera del tero (embarazo ectpico).  Problemas con la placenta, que es el rgano que nutre y provee oxgeno al beb. Estos problemas pueden incluir lo siguiente: ? Recubrimiento de la placenta sobre la abertura del tero (placenta previa). ? Desprendimiento de la placenta antes del nacimiento del beb (desprendimiento placentario).  Rotura de la bolsa de lquido amnitico antes de que comience el [place]. Cmo afecta al beb que yo fume? Antes del nacimiento Fumar durante el embarazo:  Reduce el flujo sanguneo y el oxgeno que recibe el beb.  Aumenta el riesgo de defectos congnitos, como defectos cardacos.  Aumenta la frecuencia cardaca del beb.  Reduce el desarrollo del beb dentro del tero (retraso del crecimiento intrauterino). Despus del nacimiento Los bebs cuyas madres han fumado durante el embarazo pueden:  Tener sntomas de abstinencia de nicotina.  Nacer  con labio leporino, fisura palatina u otras deformidades faciales.  Ser demasiado pequeos al nacer.  Tener un alto riesgo de lo siguiente: ? Problemas de salud graves o discapacidades de por vida. Estos pueden derivar en la necesidad de tomar ciertos medicamentos, o de recibir terapias u otros tratamientos a Barrister's clerk. ? Sndrome de muerte sbita del lactante (SMSL). Siga estas instrucciones en su casa:   No consuma ningn producto que contenga nicotina o tabaco, como cigarrillos, cigarrillos electrnicos y tabaco de Higher education careers adviser. Si necesita ayuda para dejar de fumar, consulte al mdico.  Hable con su mdico sobre las diferentes estrategias de apoyo para dejar de fumar. Algunos de los mtodos que debe considerar son los siguientes: ? Asesoramiento (asesoramiento para dejar de fumar). ? Psicoterapia. ? Acupuntura. ? Hipnosis. ? Lneas telefnicas directas para las personas que tratan de dejar de fumar.  No tome suplementos de nicotina ni medicamentos para dejar de fumar a menos que el mdico se lo indique.  Evite ser fumadora pasiva. Pdale a la gente que fuma que evite hacerlo cerca suyo.  Identifique las personas, los lugares, las cosas y las actividades que hacen que tenga deseos de fumar (factores desencadenantes). Evtelos. Dnde buscar ms informacin Obtenga ms informacin acerca del hbito de fumar durante el TransMontaigne siguientes sitios web:  March of Dimes: www.marchofdimes.org  U.S. Department of Health and Coca [employer]a (Branch y Servicios Humanos de EE. UU.): women.smokefree.Engineer, structural Cancer Society ([place] contra el Cncer): www.cancer.org  American Heart Association (Asociacin Estadounidense del Corazn): www.heart.[place] ([place]): www.cancer.gov Para obtener ayuda para dejar de fumar:  Lnea telefnica nacional para dejar de fumar: 1-800-QUIT NOW ([phone]) Comunquese con un  mdico  si:  Tiene dificultades para dejar de fumar.  Es fumadora y [place] o planea quedar embarazada.  Vuelve a fumar despus del parto. Resumen  Fumar durante el embarazo es daino para usted y el beb.  El humo del tabaco contiene sustancias dainas que pueden afectar la salud y el desarrollo del beb.  Fumar aumenta el riesgo de problemas graves, como el aborto espontneo, los defectos congnitos o el parto prematuro.  Si necesita ayuda para dejar de fumar, hable con el mdico y pregntele por las estrategias de apoyo, como el asesoramiento. Esta informacin no tiene Marine scientist el consejo del mdico. Asegrese de hacerle al mdico cualquier pregunta que tenga. Document Revised: 06/22/2019 Document Reviewed: 06/22/2019 Elsevier Patient Education  Alger anticonceptivo Contraception Choices La anticoncepcin, o los mtodos anticonceptivos, hace referencia a los mtodos o dispositivos que evitan el [place]. Mtodos hormonales Implante anticonceptivo  Un implante anticonceptivo consiste en un tubo plstico delgado que contiene una hormona. Se inserta en la parte superior del brazo. Puede Technical brewer por 3 aos. Inyecciones de progestina sola Las inyecciones de progestina sola contienen progestina, una forma sinttica de la hormona progesterona. Un mdico las administra cada 3 meses. Pldoras anticonceptivas  Las pldoras anticonceptivas son pastillas que contienen hormonas que evitan el [place]. Deben tomarse una vez al da, preferentemente a [place]. Parches anticonceptivos  El parche anticonceptivo contiene hormonas que evitan el [place]. Se coloca en la piel, debe cambiarse una vez a la semana durante tres semanas y debe retirarse en la cuarta semana. Se necesita una receta para utilizar este mtodo anticonceptivo. Anillo vaginal  Un anillo vaginal contiene hormonas que evitan el embarazo.  Se coloca en la vagina durante tres semanas y se retira en la cuarta semana. Luego se repite el proceso con un anillo nuevo. Se necesita una receta para utilizar este mtodo anticonceptivo. Anticonceptivo de emergencia Los anticonceptivos de emergencia son mtodos para evitar un embarazo despus de Best boy sexo sin proteccin. Vienen en forma de pldora y pueden tomarse hasta 5 das despus de [place]. Funcionan mejor cuando se toman lo ms pronto posible luego de Merrill Lynch. La mayora de los anticonceptivos de emergencia estn disponibles sin receta mdica. Este mtodo no debe utilizarse como el nico mtodo anticonceptivo. Mtodos de barrera Preservativo masculino  Un preservativo masculino es una vaina delgada que se coloca sobre el pene durante el sexo. Los preservativos evitan que el esperma ingrese en el cuerpo de la [place]. Pueden utilizarse con un espermicida para aumentar la efectividad. Deben desecharse luego de su uso. Preservativo femenino  Un preservativo femenino es una vaina blanda y holgada que se coloca en la vagina antes de [place]. El preservativo evita que el esperma ingrese en el cuerpo de la [place]. Deben desecharse luego de su uso. Diafragma  Un diafragma es una barrera blanda con forma de cpula. Se inserta en la vagina antes del sexo, junto con un espermicida. El diafragma bloquea el ingreso de esperma en el tero, y el espermicida mata a los espermatozoides. El Designer, television/film set en la vagina durante 6 a 8 horas despus de Best boy sexo y debe retirarse en el plazo de las 24 horas. Un diafragma es recetado y colocado por un mdico. Debe reemplazarse cada 1 a 2 aos, despus de dar a luz, de aumentar ms de 15lb (6,8kg) y de [place] plvica. Capuchn cervical  Un capuchn cervical es una copa  redonda y [place] de ltex o plstico que se coloca en el cuello uterino. Se inserta en la vagina antes del sexo, junto con un espermicida. Bloquea el ingreso del esperma en  el tero. El capuchn Medical illustrator durante 6 a 8 horas despus de Best boy sexo y debe retirarse en el plazo de las 48 horas. Un capuchn cervical debe ser recetado y colocado por un mdico. Debe reemplazarse cada 2aos. Esponja  Una esponja es una pieza blanda y circular de espuma de poliuretano que contiene espermicida. La esponja ayuda a bloquear el ingreso de esperma en el tero, y el espermicida mata a los espermatozoides. Marylyn Ishihara, debe humedecerla e insertarla en la vagina. Debe insertarse antes de Merrill Lynch, debe permanecer dentro al menos durante 6 horas despus de tener sexo y debe retirarse y Aeronautical engineer en el plazo de las 30 horas. Espermicidas Los espermicidas son sustancias qumicas que matan o bloquean al esperma y no lo dejan ingresar al cuello uterino y al tero. Vienen en forma de crema, gel, supositorio, espuma o comprimido. Un espermicida debe insertarse en la vagina con un aplicador al menos 10 o 15 minutos antes de tener sexo para dar tiempo a que surta [place]. El proceso debe repetirse cada vez que tenga sexo. Los espermicidas no requieren Furniture conservator/restorer. Anticonceptivos intrauterinos Dispositivo intrauterino (DIU). Un DIU es un dispositivo en forma de T que se coloca en el tero. Existen dos tipos:  DIU hormonal.Este tipo contiene progestina, una forma sinttica de la hormona progesterona. Este tipo puede permanecer colocado durante 3 a 5 aos.  DIU de cobre.Este tipo est recubierto con un alambre de cobre. Puede permanecer colocado durante 10 aos.  Mtodos anticonceptivos permanentes Ligadura de trompas en la mujer En este mtodo, se sellan, atan u obstruyen las trompas de Falopio durante una ciruga para Product/process development scientist que el vulo descienda [place]. Esterilizacin histeroscpica En este mtodo, se coloca un implante pequeo y flexible dentro de cada trompa de Falopio. Los implantes hacen que se forme un tejido cicatricial en las trompas de Falopio y  que las obstruya para que el espermatozoide no pueda llegar al vulo. El procedimiento demora alrededor de 3 meses para que sea [place]. Debe utilizarse otro mtodo anticonceptivo durante esos 3 meses. Esterilizacin masculina Este es un procedimiento que consiste en atar los conductos que transportan el esperma (vasectoma). Luego del procedimiento, el hombre Equities trader lquido (semen). Mtodos de planificacin natural Planificacin familiar natural En este mtodo, la pareja no tiene SPX Corporation la mujer podra quedar [place]. Mtodo calendario Buyer, retail un seguimiento de la duracin de cada ciclo menstrual, identificar los MeadWestvaco que se puede producir un Media planner y no Best boy sexo durante esos [place]. Mtodo de la ovulacin En este mtodo, la pareja evita tener sexo durante la ovulacin. Mtodo sintotrmico Este mtodo implica no tener sexo durante la ovulacin. Normalmente, la mujer comprueba la ovulacin al observar cambios en su temperatura y en la consistencia del moco cervical. Mtodo posovulacin En este mtodo, la pareja espera a que finalice la ovulacin para Adult nurse. Resumen  La anticoncepcin, o los mtodos anticonceptivos, hace referencia a los mtodos o dispositivos que evitan el [place].  Los mtodos anticonceptivos hormonales incluyen implantes, inyecciones, pastillas, parches, anillos vaginales y anticonceptivos de Freight forwarder.  Los mtodos anticonceptivos de barrera pueden incluir preservativos masculinos, preservativos femeninos, diafragmas, capuchones cervicales, esponjas y espermicidas.  Sears Holdings Corporation tipos de DIU (dispositivo intrauterino). Un DIU puede colocarse en  el tero de una mujer para evitar el embarazo durante 3 a 5 aos.  La esterilizacin permanente puede realizarse mediante un procedimiento para hombres, mujeres o ambos.  Los NIKE de Marine scientist natural incluyen no tener SPX Corporation la  mujer podra quedar [place]. Esta informacin no tiene Marine scientist el consejo del mdico. Asegrese de hacerle al mdico cualquier pregunta que tenga. Document Revised: 11/07/2017 Document Reviewed: 01/26/2017 Elsevier Patient Education  2020 [place] materna Breastfeeding  Decidir amamantar es una de las mejores elecciones que puede hacer por usted y su beb. Un cambio en las hormonas durante el embarazo hace que las mamas produzcan leche materna en las glndulas productoras de [place]. Las hormonas impiden que la leche materna sea liberada antes del nacimiento del beb. Adems, impulsan el flujo de leche luego del nacimiento. Una vez que ha comenzado a Economist, Freight forwarder beb, as Therapist, occupational succin o Social research officer, government, pueden estimular la liberacin de [place] de las glndulas productoras de [place]. Los beneficios de Colgate[employer]Palmolive investigaciones demuestran que la lactancia materna ofrece muchos beneficios de salud para bebs y [place]. Adems, ofrece una forma gratuita y conveniente de Research scientist (life sciences) al beb. Para el beb  La primera leche (calostro) ayuda a Garment/textile technologist funcionamiento del aparato digestivo del beb.  Las clulas especiales de la leche (anticuerpos) ayudan a Radio broadcast assistant las infecciones en el beb.  Los bebs que se alimentan con leche materna tambin tienen menos probabilidades de tener asma, alergias, obesidad o diabetes de tipo 2. Adems, tienen menor riesgo de sufrir el sndrome de muerte sbita del lactante (SMSL).  [place] son mejores para Engineer, water las necesidades del beb en comparacin con la Humana Inc.  La leche materna mejora el desarrollo cerebral del beb. Para usted  La lactancia materna favorece el desarrollo de un vnculo muy especial entre la madre y el beb.  Es conveniente. La leche materna es econmica y siempre est disponible a la Tree surgeon.  La lactancia materna ayuda a quemar caloras. Wyatt Mage a perder el peso ganado durante el [place].  Hace que el tero vuelva al tamao que tena antes del embarazo ms rpido. Adems, disminuye el sangrado (loquios) despus del parto.  La lactancia materna contribuye a reducir Catering manager de tener diabetes de tipo 2, osteoporosis, artritis reumatoide, enfermedades cardiovasculares y cncer de mama, ovario, tero y endometrio en el futuro. Informacin bsica sobre la lactancia Comienzo de la lactancia  Encuentre un lugar cmodo para sentarse o Acupuncturist, con un buen respaldo para el cuello y la espalda.  Coloque una almohada o una manta enrollada debajo del beb para acomodarlo a la altura de la mama (si est sentada). Las almohadas para Economist se han diseado especialmente a fin de servir de apoyo para los brazos y el beb Kellogg.  Asegrese de que la barriga del beb (abdomen) est frente a la suya.  Masajee suavemente la mama. Con las yemas de los dedos, Apple Computer bordes exteriores de la mama hacia adentro, en direccin al pezn. Esto estimula el flujo de [place]. Si la [employer] Timken Company, es posible que deba Clinical biochemist con este movimiento durante la Transport planner.  Sostenga la mama con 4 dedos por debajo y Counselling psychologist por arriba del pezn (forme la letra "C" con la mano). Asegrese de que los dedos se encuentren lejos del pezn y de la boca del beb.  Empuje suavemente los labios del beb con  el pezn o con el dedo.  Cuando la boca del beb se abra lo suficiente, acrquelo rpidamente a la mama e introduzca todo el pezn y la arola, tanto como sea posible, dentro de la boca del beb. La arola es la zona de color que rodea al pezn. ? Debe haber ms arola visible por arriba del labio superior del beb que por debajo del labio inferior. ? Los labios del beb deben estar abiertos y extendidos hacia afuera (evertidos) para asegurar que el beb se prenda de forma adecuada y cmoda. ? La lengua del beb debe estar entre la enca  inferior y Scientist, research (medical).  Asegrese de que la boca del beb est en la posicin correcta alrededor del pezn (prendido). Los labios del beb deben crear un sello sobre la mama y estar doblados hacia afuera (invertidos).  Es comn que el beb succione durante 2 a 3 minutos para que comience el flujo de [place]. Cmo debe prenderse Es muy importante que le ensee al beb cmo prenderse adecuadamente a la mama. Si el beb no se prende adecuadamente, puede causar DTE Energy Company, reducir la produccin de [place] materna y Field seismologist que el beb tenga un escaso aumento de [place]. Adems, si el beb no se prende adecuadamente al pezn, puede tragar aire durante la alimentacin. Esto puede causarle molestias al beb. Hacer eructar al beb al Eliezer Lofts de mama puede ayudarlo a liberar el aire. Sin embargo, ensearle al beb cmo prenderse a la mama adecuadamente es la mejor manera de evitar que se sienta molesto por tragar Administrator, sports se alimenta. Signos de que el beb se ha prendido adecuadamente al pezn  Tironea o succiona de modo silencioso, sin Education administrator. Los labios del beb deben estar extendidos hacia afuera (evertidos).  Se escucha que traga cada 3 o 4 succiones una vez que la Northeast Utilities ha comenzado a Airline pilot (despus de que se produzca el reflejo de eyeccin de la [place]).  Hay movimientos musculares por arriba y por delante de sus odos al Mining engineer. Signos de que el beb no se ha prendido Product manager al pezn  Hace ruidos de succin o de chasquido mientras se Haematologist.  Siente dolor en los pezones. Si cree que el beb no se prendi correctamente, deslice el dedo en la comisura de la boca y Micron Technology las encas del beb para interrumpir la succin. Intente volver a comenzar a Economist. Signos de Transport planner materna exitosa Signos del beb  El beb disminuir gradualmente el nmero de succiones o dejar de succionar por completo.  El beb se quedar dormido.  El cuerpo del beb se  relajar.  El beb retendr Ardelia Mems pequea cantidad de [employer]TEL Corporation boca.  El beb se desprender solo del [place]. Signos que presenta usted  Las mamas han aumentado la firmeza, el peso y el tamao 1 a 3 horas despus de Economist.  Estn ms blandas inmediatamente despus de amamantar.  Se producen un aumento del volumen de [place] y un cambio en su consistencia y color [place].  Los pezones no duelen, no estn agrietados ni sangran. Signos de que su beb recibe la cantidad de leche suficiente  Mojar por lo menos 1 o 2paales durante las primeras 24horas despus del nacimiento.  Mojar por lo menos 5 o 6paales cada 24horas durante la primera semana despus del nacimiento. La orina debe ser clara o de color amarillo plido a los 5das de vida.  Mojar entre 6 y 8paales cada 24horas  a medida que el beb sigue creciendo y desarrollndose.  Defeca por lo menos 3 veces en 24 horas a los 5 das de vida. Las heces deben ser blandas y Careers adviser.  Defeca por lo menos 3 veces en 24 horas a los [address] de vida. Las heces deben ser grumosas y Careers adviser.  No registra una prdida de peso mayor al 10% del peso al nacer durante los primeros [place].  Aumenta de peso un promedio de 4 a 7onzas (113 a 198g) por semana despus de los [place].  Aumenta de [place], [place], de [place] uniforme a Proofreader de los 5 das de vida, sin Museum/gallery curator prdida de peso despus de las 2semanas de vida. Despus de alimentarse, es posible que el beb regurgite una pequea cantidad de [place]. Esto es normal. Frecuencia y duracin de la lactancia El amamantamiento frecuente la ayudar a producir ms [place] y puede prevenir dolores en los pezones y las mamas extremadamente llenas (congestin [place]). Alimente al beb cuando muestre signos de hambre o si siente la necesidad de reducir la congestin de las [place]. Esto se denomina "lactancia a demanda". Las seales de que el beb  tiene hambre incluyen las siguientes:  Aumento del [place] de [place], [place] o inquietud.  Mueve la cabeza de un lado a otro.  Abre la boca cuando se le toca la mejilla o la comisura de la boca (reflejo de bsqueda).  [place], tales como sonidos de succin, se relame los labios, emite arrullos, suspiros o chirridos.  Mueve la [employer] boca y se chupa los dedos o las manos.  Est molesto o llora. Evite el uso del chupete en las primeras 4 a 6 semanas despus del nacimiento del beb. Despus de este perodo, podr usar un chupete. Las investigaciones demostraron que el uso del chupete durante Software engineer ao de vida del beb disminuye el riesgo de tener el sndrome de muerte sbita del lactante (SMSL). Permita que el nio se alimente en cada mama todo lo que desee. Cuando el beb se desprende o se queda dormido mientras se est alimentando de la primera mama, ofrzcale la segunda. Debido a que, con frecuencia, los recin nacidos estn somnolientos las primeras semanas de vida, es posible que deba despertar al beb para alimentarlo. Los horarios de Writer de un beb a otro. Sin embargo, las siguientes reglas pueden servir como gua para ayudarla a Engineer, materials que el beb se alimenta adecuadamente:  Se puede amamantar a los recin nacidos (bebs de 4 semanas o menos de vida) cada 1 a 3 horas.  No deben transcurrir ms de 3 horas durante el da o 5 horas durante la noche sin que se amamante a los recin nacidos.  Debe amamantar al beb un mnimo de 8 veces en un perodo de 24 horas. Extraccin de Owens Corning extraccin y Recruitment consultant de la leche materna le permiten asegurarse de que el beb se alimente exclusivamente de su leche materna, aun en momentos en los que no puede Economist. Esto tiene especial importancia si debe regresar al [place] en el perodo en que an est amamantando o si no puede estar presente en los momentos en que el beb debe  alimentarse. Su asesor en lactancia puede ayudarla a Pension scheme manager un mtodo de extraccin que funcione mejor para usted y Aeronautical engineer cunto tiempo es [place]. Cmo cuidar las Lincoln National Corporation durante la lactancia Los pezones pueden secarse, agrietarse y Secretary/administrator  durante la lactancia. Las siguientes recomendaciones pueden ayudarla a Theatre manager las [employer] y sanas:  Art therapist usar jabn en los pezones.  Use un sostn de soporte diseado especialmente para la lactancia materna. Evite usar sostenes con aro o sostenes muy ajustados (sostenes deportivos).  Seque al aire sus pezones durante 3 a 37minutos despus de amamantar al beb.  Utilice solo apsitos de Chiropodist sostn para Tax adviser las prdidas de [place]. La prdida de un poco de Owens Corning tomas es normal.  Utilice lanolina sobre los pezones luego de Economist. La lanolina ayuda a mantener la humedad normal de la piel. La lanolina pura no es perjudicial (no es txica) para el beb. Adems, puede extraer Cisco algunas gotas de [place] materna y Community education officer suavemente esa Express Scripts pezones para que la [place] se seque al aire. Durante las primeras semanas despus del nacimiento, algunas mujeres experimentan [place]. La congestin The Pepsi puede hacer que sienta las mamas pesadas, calientes y sensibles al tacto. El pico de la congestin mamaria ocurre en el plazo de los 3 a 5 das despus del [place]. Las siguientes recomendaciones pueden ayudarla a Public house manager la congestin mamaria:  Vace por completo las mamas al [place]. Puede aplicar calor hmedo en las mamas (en la ducha o con toallas hmedas para manos) antes de Economist o extraer Northeast Utilities. Esto aumenta la circulacin y [place] a que la [place]. Si el beb no vaca por completo las mamas cuando lo amamanta, extraiga la [place] restante despus de que haya finalizado.  Aplique compresas de hielo E[employer] Group inmediatamente despus de  Economist o extraer [place], a menos que le resulte demasiado incmodo. Haga lo siguiente: ? Ponga el hielo en una bolsa plstica. ? Coloque una Gen[employer]ine Parts piel y la bolsa de hielo. ? Coloque el hielo durante 48minutos, 2 o 3veces por da.  Asegrese de que el beb est prendido y se encuentre en la posicin correcta mientras lo alimenta. Si la congestin mamaria persiste luego de 48 horas o despus de seguir estas recomendaciones, comunquese con su mdico o un Lobbyist. Recomendaciones de salud general durante la lactancia  Consuma 3 comidas y 3 colaciones [place]. Las Toll Brothers bien alimentadas que amamantan necesitan entre 450 y [place] por Training and development officer. Puede cumplir con este requisito al aumentar la cantidad de una dieta equilibrada que realice.  Beba suficiente agua para mantener la orina clara o de color amarillo plido.  Descanse con frecuencia, reljese y siga tomando sus vitaminas prenatales para prevenir la fatiga, el estrs y los niveles bajos de vitaminas y [employer] en el cuerpo (deficiencias de nutrientes).  No consuma ningn producto que contenga nicotina o tabaco, como cigarrillos y Psychologist, sport and exercise. El beb puede verse afectado por las sustancias qumicas de los cigarrillos que pasan a la [place] materna y por la exposicin al humo ambiental del tabaco. Si necesita ayuda para dejar de fumar, consulte al mdico.  Evite el consumo de alcohol.  No consuma drogas ilegales o marihuana.  Antes de Engineer, manufacturing systems, hable con el mdico. Estos incluyen medicamentos recetados y de [place], como tambin vitaminas y suplementos a base de hierbas. Algunos medicamentos, que pueden ser perjudiciales para el beb, pueden pasar a travs de la [employer].  Puede quedar embarazada durante la lactancia. Si se desea un mtodo anticonceptivo, consulte al mdico sobre cules son [place]. Dnde  encontrar ms informacin: Liga internacional [place]  [place]ec[place]: [email or web address]. Comunquese con un mdico si:  Siente que quiere dejar de Economist o se siente frustrada con la lactancia.  Sus pezones estn agrietados o Control and instrumentation engineer.  Sus mamas estn irritadas, sensibles o calientes.  Tiene los siguientes sntomas: ? Dolor en las mamas o en los pezones. ? Un rea hinchada en cualquiera de las mamas. ? Cristy Hilts o escalofros. ? Nuseas o vmitos. ? Drenaje de otro lquido distinto de la [employer] materna desde los pezones.  Sus mamas no se llenan antes de Economist al beb para el quinto da despus del [place].  Se siente triste y deprimida.  El beb: ? Est demasiado somnoliento como para comer bien. ? Tiene problemas para dormir. ? Tiene ms de 1 semana de vida y [employer] de 6 paales en un periodo de 24 horas. ? No ha aumentado de peso a los [place].  El beb defeca menos de 3 veces en 24 horas.  La piel del beb o las partes blancas de los ojos se vuelven amarillentas. Solicite ayuda de inmediato si:  El beb est muy cansado Engineer, manufacturing) y no se quiere despertar para comer.  Le sube la fiebre sin causa. Resumen  La lactancia materna ofrece muchos beneficios de salud para bebs y [place].  Intente amamantar a su beb cuando muestre signos tempranos de hambre.  Haga cosquillas o empuje suavemente los labios del beb con el dedo o el pezn para lograr que el beb abra la boca. Acerque el beb a la mama. Asegrese de que la mayor parte de la arola se encuentre dentro de la boca del beb. Ofrzcale una mama y haga eructar al beb antes de pasar a la otra.  Hable con su mdico o asesor en lactancia si tiene dudas o problemas con la lactancia. Esta informacin no tiene Marine scientist el consejo del mdico. Asegrese de hacerle al mdico cualquier pregunta que tenga. Document Revised: 12/31/2017 Document Reviewed: 01/26/2017 Elsevier Patient Education  [place].

## 2020-09-18 NOTE — Progress Notes (Signed)
    Subjective:  Samantha Conner is a 34 y.o. G2P0010 at [redacted]w[redacted]d being seen today for ongoing prenatal care.  She is currently monitored for the following issues for this high-risk pregnancy and has Supervision of high risk pregnancy, antepartum; Gestational diabetes mellitus (GDM), antepartum; and Language barrier on their problem list.  Patient reports no complaints.  Contractions: Not present. Vag. Bleeding: None.  Movement: Present. Denies leaking of fluid.   The following portions of the patient's history were reviewed and updated as appropriate: allergies, current medications, past family history, past medical history, past social history, past surgical history and problem list. Problem list updated.  Objective:   Vitals:   09/18/20 0945  BP: 112/79  Pulse: 80  Weight: 140 lb 14.4 oz (63.9 kg)    Fetal Status:     Movement: Present     General:  Alert, oriented and cooperative. Patient is in no acute distress.  Skin: Skin is warm and dry. No rash noted.   Cardiovascular: Normal heart rate noted  Respiratory: Normal respiratory effort, no problems with respiration noted  Abdomen: Soft, gravid, appropriate for gestational age. Pain/Pressure: Present     Pelvic: Vag. Bleeding: None     Cervical exam deferred        Extremities: Normal range of motion.  Edema: None  Mental Status: Normal mood and affect. Normal behavior. Normal judgment and thought content.   Urinalysis:      Assessment and Plan:  Pregnancy: G2P0010 at [redacted]w[redacted]d  1. Supervision of high risk pregnancy, antepartum BP and FHR normal 28 week labs today Interested in Wallace booster shot (received hers in February), I recommended she obtain one, OK to mix and match if she wants Discussed contraception at length, she would like Nexplanon - Tdap vaccine greater than or equal to 7yo IM  2. Diet controlled gestational diabetes mellitus (GDM), antepartum Sugars 100% at goal Last [place] 11/9 w EFW 56%, normal AFI, has f/u on  09/25/2020  3. Language barrier Spanish, provider fluent and approved  Preterm labor symptoms and general obstetric precautions including but not limited to vaginal bleeding, contractions, leaking of fluid and fetal movement were reviewed in detail with the patient. Please refer to After Visit Summary for other counseling recommendations.  Return in 2 weeks (on 10/02/2020) for [facility], ob visit.   Clarnce Flock, MD

## 2020-09-19 LAB — CBC
Hematocrit: 36.8 % (ref 34.0–46.6)
Hemoglobin: 12.6 g/dL (ref 11.1–15.9)
MCH: 29.9 pg (ref 26.6–33.0)
MCHC: 34.2 g/dL (ref 31.5–35.7)
MCV: 87 fL (ref 79–97)
Platelets: 158 10*3/uL (ref 150–450)
RBC: 4.22 x10E6/uL (ref 3.77–5.28)
RDW: 13.1 % (ref 11.7–15.4)
WBC: 7.6 10*3/uL (ref 3.4–10.8)

## 2020-09-19 LAB — RPR: RPR Ser Ql: NONREACTIVE

## 2020-09-19 LAB — HIV ANTIBODY (ROUTINE TESTING W REFLEX): HIV Screen 4th Generation wRfx: NONREACTIVE

## 2020-09-25 ENCOUNTER — Other Ambulatory Visit: Payer: Self-pay | Admitting: *Deleted

## 2020-09-25 ENCOUNTER — Ambulatory Visit: Payer: Self-pay | Admitting: *Deleted

## 2020-09-25 ENCOUNTER — Encounter: Payer: Self-pay | Admitting: *Deleted

## 2020-09-25 ENCOUNTER — Ambulatory Visit: Payer: Self-pay | Attending: Obstetrics and Gynecology

## 2020-09-25 ENCOUNTER — Other Ambulatory Visit: Payer: Self-pay

## 2020-09-25 DIAGNOSIS — O0990 Supervision of high risk pregnancy, unspecified, unspecified trimester: Secondary | ICD-10-CM

## 2020-09-25 DIAGNOSIS — O24410 Gestational diabetes mellitus in pregnancy, diet controlled: Secondary | ICD-10-CM | POA: Insufficient documentation

## 2020-09-25 DIAGNOSIS — Z362 Encounter for other antenatal screening follow-up: Secondary | ICD-10-CM

## 2020-09-25 DIAGNOSIS — O368330 Maternal care for abnormalities of the fetal heart rate or rhythm, third trimester, not applicable or unspecified: Secondary | ICD-10-CM

## 2020-09-25 DIAGNOSIS — Z3A28 28 weeks gestation of pregnancy: Secondary | ICD-10-CM

## 2020-10-02 ENCOUNTER — Other Ambulatory Visit: Payer: Self-pay

## 2020-10-02 ENCOUNTER — Ambulatory Visit (INDEPENDENT_AMBULATORY_CARE_PROVIDER_SITE_OTHER): Payer: Self-pay | Admitting: Obstetrics and Gynecology

## 2020-10-02 VITALS — BP 101/73 | HR 80 | Wt 141.8 lb

## 2020-10-02 DIAGNOSIS — Z789 Other specified health status: Secondary | ICD-10-CM

## 2020-10-02 DIAGNOSIS — O24410 Gestational diabetes mellitus in pregnancy, diet controlled: Secondary | ICD-10-CM

## 2020-10-02 DIAGNOSIS — Z3A29 29 weeks gestation of pregnancy: Secondary | ICD-10-CM

## 2020-10-02 DIAGNOSIS — O0990 Supervision of high risk pregnancy, unspecified, unspecified trimester: Secondary | ICD-10-CM

## 2020-10-02 MED ORDER — GLUCOSE BLOOD VI STRP: ORAL_STRIP | 12 refills | Status: DC

## 2020-10-02 NOTE — Progress Notes (Signed)
Spanish Interpreter Colbert Coyer T.

## 2020-10-02 NOTE — Patient Instructions (Addendum)
Tercer trimestre de embarazo Third Trimester of Pregnancy El tercer trimestre comprende desde la semana28 hasta la semana40 (desde el mes7 hasta el mes9). El tercer trimestre es un perodo en el que el beb en gestacin (feto) crece rpidamente. Hacia el final del noveno mes, el feto mide alrededor de 20pulgadas (45cm) de largo y pesa entre 6 y 10 libras (2,700 y 4,500kg). Cambios en el cuerpo durante el tercer trimestre Su organismo continuar atravesando por muchos cambios durante el embarazo. Estos cambios varan de una mujer a otra. Durante el tercer trimestre:  Seguir aumentando de peso. Es de esperar que aumente entre 25 y 35libras (11 y 16kg) hacia el final del embarazo.  Podrn aparecer las primeras estras en las caderas, el abdomen y las mamas.  Puede tener necesidad de orinar con ms frecuencia porque el feto baja hacia la pelvis y ejerce presin sobre la vejiga.  Puede desarrollar o continuar teniendo acidez estomacal. Esto se debe a que el aumento de las hormonas hace que los msculos en el tubo digestivo trabajen ms lentamente.  Puede desarrollar o continuar teniendo estreimiento debido a que el aumento de las hormonas ralentiza la digestin y hace que los msculos que empujan los desechos a travs de los intestinos se relajen.  Puede desarrollar hemorroides. Estas son venas hinchadas (venas varicosas) en el recto que pueden causar picazn o dolor.  Puede desarrollar venas hinchadas y abultadas (venas varicosas) en las piernas.  Puede presentar ms dolor en la pelvis, la espalda o los muslos. Esto se debe al aumento de peso y al aumento de las hormonas que relajan las articulaciones.  Tal vez haya cambios en el cabello. Esto cambios pueden incluir su engrosamiento, crecimiento rpido y cambios en la textura. Adems, a algunas mujeres se les cae el cabello durante o despus del embarazo, o tienen el cabello seco o fino. Lo ms probable es que el cabello se le normalice  despus del nacimiento del beb.  Sus pechos seguirn creciendo y se pondrn cada vez ms sensibles. Un lquido amarillo (calostro) puede salir de sus pechos. Esta es la primera leche que usted produce para su beb.  El ombligo puede salir hacia afuera.  Puede observar que se le hinchan las manos, el rostro o los tobillos.  Puede presentar un aumento del hormigueo o entumecimiento en las manos, brazos y piernas. La piel de su vientre tambin puede sentirse entumecida.  Puede sentir que le falta el aire debido a que se expande el tero.  Puede tener ms problemas para dormir. Esto puede deberse al tamao de su vientre, una mayor necesidad de orinar y un aumento en el metabolismo de su cuerpo.  Puede notar que el feto "baja" o lo siente ms bajo, en el abdomen (aligeramiento).  Puede tener un aumento de la secrecin vaginal.  Puede notar que las articulaciones se sienten flojas y puede sentir dolor alrededor del hueso plvico. Qu debe esperar en las visitas prenatales Le harn exmenes prenatales cada 2semanas hasta la semana36. A partir de ese momento le harn los exmenes semanales. Durante una visita prenatal de rutina:  La pesarn para asegurarse de que usted y el beb estn creciendo normalmente.  Le tomarn la presin arterial.  Le medirn el abdomen para controlar el desarrollo del beb.  Se escucharn los latidos cardacos fetales.  Se evaluarn los resultados de los estudios solicitados en visitas anteriores.  Le revisarn el cuello del tero cuando est prxima la fecha de parto para controlar si el cuello uterino   se ha afinado o adelgazado (borrado).  Le harn una prueba de estreptococos del grupo B. Esto sucede entre las semanas 35 y 37. El mdico puede preguntarle lo siguiente:  Cmo le gustara que fuera el parto.  Cmo se siente.  Si siente los movimientos del beb.  Si ha tenido sntomas anormales, como prdida de lquido, sangrado, dolores de cabeza  intensos o clicos abdominales.  Si est consumiendo algn producto que contenga tabaco, como cigarrillos, tabaco de mascar y cigarrillos electrnicos.  Si tiene alguna pregunta. Otros exmenes o estudios de deteccin que pueden realizarse durante el tercer trimestre incluyen lo siguiente:  Anlisis de sangre para controlar los niveles de hierro (anemia).  Controles fetales para determinar su salud, nivel de actividad y crecimiento. Si tiene alguna enfermedad o hay problemas durante el embarazo, le harn estudios.  Prueba sin estrs. Esta prueba verifica la salud de su beb y se utiliza para detectar signos de problemas, tales como si el beb no est recibiendo suficiente oxgeno. Durante esta prueba, se coloca un cinturn alrededor de su vientre. Al moverse el beb, se controla su frecuencia cardaca. Qu es el falso trabajo de parto? El falso trabajo de parto es una afeccin en la que se sienten pequeos e irregulares espasmos de los msculos del tero (contracciones) que generalmente desaparecen al hacer reposo, cambiar de posicin o al beber agua. Estas contracciones se llaman contracciones de Braxton Hicks. Las contracciones pueden durar horas, das o incluso semanas, antes de que el verdadero trabajo de parto se inicie. Si las contracciones ocurren a intervalos regulares, se vuelven ms frecuentes, aumentan en intensidad o se vuelven dolorosas, debera ver al mdico.  Cules son los signos del trabajo de parto?  Clicos abdominales.  Contracciones regulares que comienzan en intervalos de 10 minutos y se vuelven ms fuertes y ms frecuentes con el tiempo.  Contracciones que comienzan en la parte superior del tero y se extienden hacia abajo, a la zona inferior del abdomen y la espalda.  Aumento de la presin en la pelvis y dolor latente en la espalda.  Una secrecin de mucosidad acuosa o con sangre que sale de la vagina.  Prdida de lquido amnitico. Esto tambin se conoce como  "ruptura de la bolsa de las aguas". Esto puede ser un chorro o un goteo constante y lento de lquido. Informe a su mdico si tiene un color u olor extrao. Si tiene alguno de estos signos, llame a su mdico de inmediato, incluso si es antes de la fecha de parto. Siga estas indicaciones en su casa: Medicamentos  Siga las indicaciones del mdico en relacin con el uso de medicamentos. Durante el embarazo, hay medicamentos que pueden tomarse y otros que no.  Tome vitaminas prenatales que contengan por lo menos 600microgramos (?g) de cido flico.  Si est estreida, tome un laxante suave, si el mdico lo autoriza. Qu debe comer y beber   Lleve una dieta equilibrada que incluya gran cantidad de frutas y verduras frescas, cereales integrales, buenas fuentes de protenas como carnes magras, huevos o tofu, y lcteos descremados. El mdico la ayudar a determinar la cantidad de peso que puede aumentar.  No coma carne cruda ni quesos sin cocinar. Estos elementos contienen grmenes que pueden causar defectos congnitos en el beb.  Si no consume muchos alimentos con calcio, hable con su mdico sobre si debera tomar un suplemento diario de calcio.  La ingesta diaria de cuatro o cinco comidas pequeas en lugar de tres comidas abundantes.  Limite el   consumo de alimentos con alto contenido de grasas y azcares procesados, como alimentos fritos o dulces.  Para evitar el estreimiento: ? Bebe suficiente lquido para mantener la orina clara o de color amarillo plido. ? Consuma alimentos ricos en fibra, como frutas y verduras frescas, cereales integrales y frijoles. Actividad  Haga ejercicio solamente como se lo haya indicado el mdico. La mayora de las mujeres pueden continuar su rutina de ejercicios durante el embarazo. Intente realizar como mnimo 30minutos de actividad fsica por lo menos 5das a la semana. Deje de hacer ejercicio si experimenta contracciones uterinas.  Evite levantar pesos  excesivos.  No haga ejercicio en condiciones de calor o humedad extremas, o a grandes alturas.  Use zapatos cmodos de tacn bajo.  Adopte una buena postura.  Puede seguir teniendo relaciones sexuales, excepto que el mdico le diga lo contrario. Alivio del dolor y del malestar  Haga pausas frecuentes y descanse con las piernas elevadas si tiene calambres en las piernas o dolor en la zona lumbar.  Dese baos de asiento con agua tibia para aliviar el dolor o las molestias causadas por las hemorroides. Use una crema para las hemorroides si el mdico la autoriza.  Use un sostn que le brinde buen soporte para prevenir las molestias causadas por la sensibilidad en los pechos.  Si tiene venas varicosas: ? Use pantimedias que brinden soporte o medias de compresin como se lo haya indicado el mdico. ? Eleve los pies durante 15minutos, 3 o 4veces por da. Cuidados prenatales  Escriba sus preguntas. Llvelas cuando concurra a las visitas prenatales.  Concurra a todas las visitas prenatales tal como se lo haya indicado el mdico. Esto es importante. Seguridad  Use el cinturn de seguridad en todo momento mientras conduce.  Haga una lista de los nmeros de telfono de emergencia, que incluya los nmeros de telfono de familiares, amigos, el hospital y los departamentos de polica y bomberos. Instrucciones generales  Evite el contacto con las bandejas sanitarias de los gatos y la tierra que estos animales usan. Estos elementos contienen grmenes que pueden causar defectos congnitos en el beb. Si tiene un gato, pdale a alguien que limpie la caja de arena por usted.  No haga viajes largos excepto que sea absolutamente necesario y solo con la autorizacin de su mdico.  No se d baos de inmersin en agua caliente, baos turcos ni saunas.  No beber alcohol.  No consuma ningn producto que contenga nicotina o tabaco, como cigarrillos y cigarrillos electrnicos. Si necesita ayuda para  dejar de fumar, consulte al mdico.  No use hierbas medicinales ni medicamentos que no le hayan recetado. Estas sustancias qumicas afectan la formacin y el desarrollo del beb.  No se haga duchas vaginales ni use tampones o toallas higinicas perfumadas.  No mantenga las piernas cruzadas durante largos periodos de tiempo.  Para prepararse para la llegada de su beb: ? Tome clases prenatales para entender, practicar, y hacer preguntas sobre el trabajo de parto y el parto. ? Haga un ensayo de la partida al hospital. ? Visite el hospital y recorra el rea de maternidad. ? Pida un permiso de maternidad o paternidad a sus empleadores. ? Organice para que algn familiar o amigo cuide a sus mascotas mientras usted est en el hospital. ? Compre un asiento de seguridad orientado hacia atrs, y asegrese de saber cmo instalarlo en su automvil. ? Prepare el bolso que llevar al hospital. ? Prepare la habitacin del beb. Asegrese de quitar todas las almohadas y animales   de peluche de la cuna del beb para evitar la asfixia.  Visite a su dentista si no lo ha [place]ar[place] manager. Use un cepillo de dientes blando para higienizarse los dientes y psese el hilo dental con suavidad. Comunquese con un mdico si:  No est segura de que est en trabajo de parto o de que ha roto la bolsa de las aguas.  Se siente mareada.  Siente clicos leves, presin en la pelvis o dolor persistente en el abdomen.  Siente dolor en la parte inferior de la espalda.  Tiene nuseas, vmitos o diarrea persistentes.  Margette Fast secrecin vaginal inusual o con mal olor.  Siente dolor al [employer]. Solicite ayuda de inmediato si:  Rompe la bolsa de las aguas antes de la semana 37.  Tiene contracciones regulares en intervalos de menos de 5 minutos antes de la semana 52.  Tiene fiebre.  Tiene una prdida de lquido por la vagina.  Tiene sangrado o pequeas prdidas vaginales.  Tiene dolor o clicos  abdominales intensos.  Baja de peso o sube de peso rpidamente.  Tiene dificultad para respirar y siente dolor de pecho.  Sbitamente se le hinchan mucho el rostro, las [place], los tobillos, los pies o las piernas.  Su beb se mueve menos de 10 veces en 2 horas.  Siente un dolor de cabeza intenso que no se alivia al tomar [employer].  Nota cambios en la visin. Resumen  El tercer trimestre comprende desde la [id number] [facility] [id number], es decir, desde el mes7 hasta el mes9. El tercer trimestre es un perodo en el que el beb en gestacin (feto) crece rpidamente.  Durante el tercer trimestre, su incomodidad puede aumentar a medida que usted y su beb continan aumentando de [place]. Es posible que tenga dolor abdominal, en las piernas y en la [place], [place] para dormir y [place] mayor necesidad de Garment/textile technologist.  Durante el tercer trimestre, sus pechos seguirn creciendo y se pondrn cada vez ms sensibles. Un lquido amarillo Public affairs consultant) puede salir de sus pechos. Esta es la primera leche que usted produce para su beb.  El falso trabajo de parto es una afeccin en la que se sienten pequeos e irregulares espasmos de los msculos del tero (contracciones) que a Programme researcher, broadcasting/film/video. Estas contracciones se llaman contracciones de [employer]. Las [employer] pueden durar horas, das o incluso semanas, antes de que el verdadero trabajo de parto se inicie.  Los signos del trabajo de parto pueden incluir: calambres abdominales; contracciones regulares que comienzan en intervalos de 10 minutos y se vuelven ms fuertes y ms frecuentes con el tiempo; una secrecin de mucosidad acuosa o con sangre que sale de la vagina; aumento de la presin en la pelvis y Social research officer, government latente en la espalda; y prdida de lquido amnitico. Esta informacin no tiene Marine scientist el consejo del mdico. Asegrese de hacerle al mdico cualquier pregunta que tenga. Document Revised: 02/17/2017 Document Reviewed:  02/17/2017 Elsevier Patient Education  [place].   Evaluacin de los movimientos fetales Fetal Movement Counts Nombre del paciente: ________________________________________________ Karalee Height estimada: ____________________ Allean Found evaluacin de los movimientos fetales?  Una evaluacin de los movimientos fetales es el registro del nmero de veces que siente que el beb se mueve durante un cierto perodo de [place]. Esto tambin se puede denominar recuento de patadas fetales. Una evaluacin de movimientos fetales se recomienda a todas las embarazadas. Es posible que le indiquen que comience a Chief of Staff los movimientos fetales desde la semana  [place]. Preste atencin cuando sienta que el beb est ms activo. Podr detectar los ciclos en que el beb duerme y est despierto. Tambin podr detectar que ciertas cosas hacen que su beb se mueva ms. Deber realizar una evaluacin de los movimientos fetales en las siguientes situaciones:  Cuando el beb est ms activo habitualmente.  A la [employer], todos los [place]. Un buen momento para evaluar los movimientos fetales es cuando est descansando, despus de haber comido y bebido algo. Cmo debo contar los movimientos fetales? 1. Encuentre un lugar tranquilo y cmodo. Sintese o acustese de lado. 2. Anote la fecha, la hora de inicio y de finalizacin y la cantidad de movimientos que sinti entre esas dos horas. Lleve esta informacin a las visitas de control. 3. Anote la hora de inicio cuando Scientific laboratory technician. 4. Cuente las pataditas, revoloteos, chasquidos, vueltas o pinchazos. Debe sentir al menos 31movimientos. 5. Puede dejar de contar despus de haber sentido 10 movimientos o de haber contado [employer]. Anote la hora de finalizacin. 6. Si no siente 81movimientos en 2horas, comunquese con su mdico para obtener ms indicaciones. Es posible que el mdico quiera realizar estudios adicionales para Building surveyor del beb. Comunquese con un mdico si:  Siente menos de 9movimientos en 2horas.  El beb no se mueve tanto como suele hacerlo. Fecha: ____________ Eda Paschal inicio: ____________ Eda Paschal finalizacin: ____________ Movimientos: ____________ Toma Copier: ____________ Eda Paschal inicio: ____________ Eda Paschal finalizacin: ____________ Movimientos: ____________ Toma Copier: ____________ Eda Paschal inicio: ____________ Eda Paschal finalizacin: ____________ Movimientos: ____________ Toma Copier: ____________ Eda Paschal inicio: ____________ Eda Paschal finalizacin: ____________ Movimientos: ____________ Toma Copier: ____________ Eda Paschal inicio: ____________ Mellody Drown de finalizacin: ____________ Movimientos: ____________ Toma Copier: ____________ Eda Paschal inicio: ____________ Mellody Drown de finalizacin: ____________ Movimientos: ____________ Toma Copier: ____________ Eda Paschal inicio: ____________ Mellody Drown de finalizacin: ____________ Movimientos: ____________ Toma Copier: ____________ Eda Paschal inicio: ____________ Eda Paschal finalizacin: ____________ Movimientos: ____________ Toma Copier: ____________ Eda Paschal inicio: ____________ Mellody Drown de finalizacin: ____________ Movimientos: ____________ Esta informacin no tiene como fin reemplazar el consejo del mdico. Asegrese de hacerle al mdico cualquier pregunta que tenga. Document Revised: 08/02/2019 Document Reviewed: 08/02/2019 Elsevier Patient Education  [place].

## 2020-10-02 NOTE — Progress Notes (Signed)
° °  PRENATAL VISIT NOTE  Subjective:  Samantha Conner is a 34 y.o. G2P0010 at [redacted]w[redacted]d being seen today for ongoing prenatal care.  She is currently monitored for the following issues for this high-risk pregnancy and has Supervision of high risk pregnancy, antepartum; Gestational diabetes mellitus (GDM), antepartum; Language barrier; and [redacted] weeks gestation of pregnancy on their problem list.  Patient reports no complaints.  Contractions: Not present. Vag. Bleeding: None.  Movement: Present. Denies leaking of fluid.   Checking BG levels 2 hours after all meals and prior to breakfast. Provided paper BG log today with ALL levels at goal. Reports ongoing efforts to eat a healthy diet with minimal added sugar.  The following portions of the patient's history were reviewed and updated as appropriate: allergies, current medications, past family history, past medical history, past social history, past surgical history and problem list.   Objective:   Vitals:   10/02/20 1611  BP: 101/73  Pulse: 80  Weight: 141 lb 12.8 oz (64.3 kg)    Fetal Status: Fetal Heart Rate (bpm): 137   Movement: Present     General:  Alert, oriented and cooperative. Patient is in no acute distress.  Skin: Skin is warm and dry. No rash noted.   Cardiovascular: Normal heart rate noted  Respiratory: Normal respiratory effort, no problems with respiration noted  Abdomen: Soft, gravid, appropriate for gestational age.  Pain/Pressure: Absent     Pelvic: Cervical exam deferred        Extremities: Normal range of motion.  Edema: None  Mental Status: Normal mood and affect. Normal behavior. Normal judgment and thought content.   Assessment and Plan:  Pregnancy: G2P0010 at [redacted]w[redacted]d 1. [redacted] weeks gestation of pregnancy, Supervision of high risk pregnancy, antepartum: No red flag signs/symptoms today. Third trimester labs wnl except for 3hr gtt. Pt endorses +FM and appropriate fundal height and +FHTs today. Plan for breast and bottle  feeding. Desires postpartum Nexplanon at [facility]. S/p flu, tdap and COVID vaccine x1. - counseled on benefit of COVID vaccine booster - continue prenatal vitamin + ASA daily - rtc in 2 weeks for f/u prenatal appt  2. Diet controlled gestational diabetes mellitus (GDM), antepartum: EFW 56% at [redacted]w[redacted]d. Pt checking BG levels QID with good adherence. Very motivated to eat a healthy diet with minimal added sugar. Appropriate weight gain in pregnancy. All BG levels on paper log at goal today. - praised pt for her continued efforts to check BG levels, engage in healthy eating - plan for f/u growth ultrasound on 10/23/20  4. Language barrier: Spanish interpreter present for entirety of visit. Chief Strategy Officer also certified in [place].  Preterm labor symptoms and general obstetric precautions including but not limited to vaginal bleeding, contractions, leaking of fluid and fetal movement were reviewed in detail with the patient. Please refer to After Visit Summary for other counseling recommendations.   Return in about 2 weeks (around 10/16/2020).  Future Appointments  Date Time Provider [place]  10/16/2020  1:55 PM Clarnce Flock, MD [facility]  10/23/2020  2:15 PM [facility] NURSE [facility]  10/23/2020  2:30 PM [facility] US3 [facility]    Randa Ngo, MD OB Fellow, [facility] 10/03/2020 3:47 PM

## 2020-10-16 ENCOUNTER — Ambulatory Visit (INDEPENDENT_AMBULATORY_CARE_PROVIDER_SITE_OTHER): Payer: Self-pay | Admitting: Family Medicine

## 2020-10-16 ENCOUNTER — Other Ambulatory Visit: Payer: Self-pay

## 2020-10-16 VITALS — BP 106/72 | HR 93 | Wt 142.1 lb

## 2020-10-16 DIAGNOSIS — O24410 Gestational diabetes mellitus in pregnancy, diet controlled: Secondary | ICD-10-CM

## 2020-10-16 DIAGNOSIS — Z789 Other specified health status: Secondary | ICD-10-CM

## 2020-10-16 DIAGNOSIS — O0990 Supervision of high risk pregnancy, unspecified, unspecified trimester: Secondary | ICD-10-CM

## 2020-10-16 NOTE — Progress Notes (Addendum)
   Subjective:  Samantha Conner is a 34 y.o. G2P0010 at [redacted]w[redacted]d being seen today for ongoing prenatal care.  She is currently monitored for the following issues for this high-risk pregnancy and has Supervision of high risk pregnancy, antepartum; Gestational diabetes mellitus (GDM), antepartum; Language barrier; and [redacted] weeks gestation of pregnancy on their problem list.  Patient reports no complaints.  Contractions: Not present. Vag. Bleeding: None.  Movement: Present. Denies leaking of fluid.   The following portions of the patient's history were reviewed and updated as appropriate: allergies, current medications, past family history, past medical history, past social history, past surgical history and problem list. Problem list updated.  Objective:   Vitals:   10/16/20 1401  BP: 106/72  Pulse: 93  Weight: 142 lb 1.6 oz (64.5 kg)    Fetal Status: Fetal Heart Rate (bpm): 128   Movement: Present     General:  Alert, oriented and cooperative. Patient is in no acute distress.  Skin: Skin is warm and dry. No rash noted.   Cardiovascular: Normal heart rate noted  Respiratory: Normal respiratory effort, no problems with respiration noted  Abdomen: Soft, gravid, appropriate for gestational age. Pain/Pressure: Absent     Pelvic: Vag. Bleeding: None Vag D/C Character: Yellow   Cervical exam deferred        Extremities: Normal range of motion.  Edema: None  Mental Status: Normal mood and affect. Normal behavior. Normal judgment and thought content.   Urinalysis:      Assessment and Plan:  Pregnancy: G2P0010 at [redacted]w[redacted]d  1. Supervision of high risk pregnancy, antepartum FHR and BP nomal  2. Language barrier Spanish  3. Diet controlled gestational diabetes mellitus (GDM), antepartum All sugars at goal over past two weeks ago Following w MFM, has growth [place] scheduled for next week  Preterm labor symptoms and general obstetric precautions including but not limited to vaginal bleeding,  contractions, leaking of fluid and fetal movement were reviewed in detail with the patient. Please refer to After Visit Summary for other counseling recommendations.  Return in 2 weeks (on 10/30/2020) for [facility], ob visit.   Venora Maples, MD

## 2020-10-16 NOTE — Patient Instructions (Signed)
 Tercer trimestre de embarazo Third Trimester of Pregnancy El tercer trimestre comprende desde la semana28 hasta la semana40 (desde el mes7 hasta el mes9). El tercer trimestre es un perodo en el que el beb en gestacin (feto) crece rpidamente. Hacia el final del noveno mes, el feto mide alrededor de 20pulgadas (45cm) de largo y pesa entre 6 y 10 libras (2,700 y 4,500kg). Cambios en el cuerpo durante el tercer trimestre Su organismo continuar atravesando por muchos cambios durante el embarazo. Estos cambios varan de una mujer a otra. Durante el tercer trimestre:  Seguir aumentando de peso. Es de esperar que aumente entre 25 y 35libras (11 y 16kg) hacia el final del embarazo.  Podrn aparecer las primeras estras en las caderas, el abdomen y las mamas.  Puede tener necesidad de orinar con ms frecuencia porque el feto baja hacia la pelvis y ejerce presin sobre la vejiga.  Puede desarrollar o continuar teniendo acidez estomacal. Esto se debe a que el aumento de las hormonas hace que los msculos en el tubo digestivo trabajen ms lentamente.  Puede desarrollar o continuar teniendo estreimiento debido a que el aumento de las hormonas ralentiza la digestin y hace que los msculos que empujan los desechos a travs de los intestinos se relajen.  Puede desarrollar hemorroides. Estas son venas hinchadas (venas varicosas) en el recto que pueden causar picazn o dolor.  Puede desarrollar venas hinchadas y abultadas (venas varicosas) en las piernas.  Puede presentar ms dolor en la pelvis, la espalda o los muslos. Esto se debe al aumento de peso y al aumento de las hormonas que relajan las articulaciones.  Tal vez haya cambios en el cabello. Esto cambios pueden incluir su engrosamiento, crecimiento rpido y cambios en la textura. Adems, a algunas mujeres se les cae el cabello durante o despus del embarazo, o tienen el cabello seco o fino. Lo ms probable es que el cabello se le  normalice despus del nacimiento del beb.  Sus pechos seguirn creciendo y se pondrn cada vez ms sensibles. Un lquido amarillo (calostro) puede salir de sus pechos. Esta es la primera leche que usted produce para su beb.  El ombligo puede salir hacia afuera.  Puede observar que se le hinchan las manos, el rostro o los tobillos.  Puede presentar un aumento del hormigueo o entumecimiento en las manos, brazos y piernas. La piel de su vientre tambin puede sentirse entumecida.  Puede sentir que le falta el aire debido a que se expande el tero.  Puede tener ms problemas para dormir. Esto puede deberse al tamao de su vientre, una mayor necesidad de orinar y un aumento en el metabolismo de su cuerpo.  Puede notar que el feto "baja" o lo siente ms bajo, en el abdomen (aligeramiento).  Puede tener un aumento de la secrecin vaginal.  Puede notar que las articulaciones se sienten flojas y puede sentir dolor alrededor del hueso plvico. Qu debe esperar en las visitas prenatales Le harn exmenes prenatales cada 2semanas hasta la semana36. A partir de ese momento le harn los exmenes semanales. Durante una visita prenatal de rutina:  La pesarn para asegurarse de que usted y el beb estn creciendo normalmente.  Le tomarn la presin arterial.  Le medirn el abdomen para controlar el desarrollo del beb.  Se escucharn los latidos cardacos fetales.  Se evaluarn los resultados de los estudios solicitados en visitas anteriores.  Le revisarn el cuello del tero cuando est prxima la fecha de parto para controlar si el cuello   uterino se ha afinado o adelgazado (borrado).  Le harn una prueba de estreptococos del grupo B. Esto sucede entre las semanas 35 y 37. El mdico puede preguntarle lo siguiente:  Cmo le gustara que fuera el parto.  Cmo se siente.  Si siente los movimientos del beb.  Si ha tenido sntomas anormales, como prdida de lquido, sangrado, dolores de  cabeza intensos o clicos abdominales.  Si est consumiendo algn producto que contenga tabaco, como cigarrillos, tabaco de mascar y cigarrillos electrnicos.  Si tiene alguna pregunta. Otros exmenes o estudios de deteccin que pueden realizarse durante el tercer trimestre incluyen lo siguiente:  Anlisis de sangre para controlar los niveles de hierro (anemia).  Controles fetales para determinar su salud, nivel de actividad y crecimiento. Si tiene alguna enfermedad o hay problemas durante el embarazo, le harn estudios.  Prueba sin estrs. Esta prueba verifica la salud de su beb y se utiliza para detectar signos de problemas, tales como si el beb no est recibiendo suficiente oxgeno. Durante esta prueba, se coloca un cinturn alrededor de su vientre. Al moverse el beb, se controla su frecuencia cardaca. Qu es el falso trabajo de parto? El falso trabajo de parto es una afeccin en la que se sienten pequeos e irregulares espasmos de los msculos del tero (contracciones) que generalmente desaparecen al hacer reposo, cambiar de posicin o al beber agua. Estas contracciones se llaman contracciones de Braxton Hicks. Las contracciones pueden durar horas, das o incluso semanas, antes de que el verdadero trabajo de parto se inicie. Si las contracciones ocurren a intervalos regulares, se vuelven ms frecuentes, aumentan en intensidad o se vuelven dolorosas, debera ver al mdico.  Cules son los signos del trabajo de parto?  Clicos abdominales.  Contracciones regulares que comienzan en intervalos de 10 minutos y se vuelven ms fuertes y ms frecuentes con el tiempo.  Contracciones que comienzan en la parte superior del tero y se extienden hacia abajo, a la zona inferior del abdomen y la espalda.  Aumento de la presin en la pelvis y dolor latente en la espalda.  Una secrecin de mucosidad acuosa o con sangre que sale de la vagina.  Prdida de lquido amnitico. Esto tambin se conoce  como "ruptura de la bolsa de las aguas". Esto puede ser un chorro o un goteo constante y lento de lquido. Informe a su mdico si tiene un color u olor extrao. Si tiene alguno de estos signos, llame a su mdico de inmediato, incluso si es antes de la fecha de parto. Siga estas indicaciones en su casa: Medicamentos  Siga las indicaciones del mdico en relacin con el uso de medicamentos. Durante el embarazo, hay medicamentos que pueden tomarse y otros que no.  Tome vitaminas prenatales que contengan por lo menos 600microgramos (?g) de cido flico.  Si est estreida, tome un laxante suave, si el mdico lo autoriza. Qu debe comer y beber   Lleve una dieta equilibrada que incluya gran cantidad de frutas y verduras frescas, cereales integrales, buenas fuentes de protenas como carnes magras, huevos o tofu, y lcteos descremados. El mdico la ayudar a determinar la cantidad de peso que puede aumentar.  No coma carne cruda ni quesos sin cocinar. Estos elementos contienen grmenes que pueden causar defectos congnitos en el beb.  Si no consume muchos alimentos con calcio, hable con su mdico sobre si debera tomar un suplemento diario de calcio.  La ingesta diaria de cuatro o cinco comidas pequeas en lugar de tres comidas abundantes.  Limite   el consumo de alimentos con alto contenido de grasas y azcares procesados, como alimentos fritos o dulces.  Para evitar el estreimiento: ? Bebe suficiente lquido para mantener la orina clara o de color amarillo plido. ? Consuma alimentos ricos en fibra, como frutas y verduras frescas, cereales integrales y frijoles. Actividad  Haga ejercicio solamente como se lo haya indicado el mdico. La mayora de las mujeres pueden continuar su rutina de ejercicios durante el embarazo. Intente realizar como mnimo 30minutos de actividad fsica por lo menos 5das a la semana. Deje de hacer ejercicio si experimenta contracciones uterinas.  Evite levantar  pesos excesivos.  No haga ejercicio en condiciones de calor o humedad extremas, o a grandes alturas.  Use zapatos cmodos de tacn bajo.  Adopte una buena postura.  Puede seguir teniendo relaciones sexuales, excepto que el mdico le diga lo contrario. Alivio del dolor y del malestar  Haga pausas frecuentes y descanse con las piernas elevadas si tiene calambres en las piernas o dolor en la zona lumbar.  Dese baos de asiento con agua tibia para aliviar el dolor o las molestias causadas por las hemorroides. Use una crema para las hemorroides si el mdico la autoriza.  Use un sostn que le brinde buen soporte para prevenir las molestias causadas por la sensibilidad en los pechos.  Si tiene venas varicosas: ? Use pantimedias que brinden soporte o medias de compresin como se lo haya indicado el mdico. ? Eleve los pies durante 15minutos, 3 o 4veces por da. Cuidados prenatales  Escriba sus preguntas. Llvelas cuando concurra a las visitas prenatales.  Concurra a todas las visitas prenatales tal como se lo haya indicado el mdico. Esto es importante. Seguridad  Use el cinturn de seguridad en todo momento mientras conduce.  Haga una lista de los nmeros de telfono de emergencia, que incluya los nmeros de telfono de familiares, amigos, el hospital y los departamentos de polica y bomberos. Instrucciones generales  Evite el contacto con las bandejas sanitarias de los gatos y la tierra que estos animales usan. Estos elementos contienen grmenes que pueden causar defectos congnitos en el beb. Si tiene un gato, pdale a alguien que limpie la caja de arena por usted.  No haga viajes largos excepto que sea absolutamente necesario y solo con la autorizacin de su mdico.  No se d baos de inmersin en agua caliente, baos turcos ni saunas.  No beber alcohol.  No consuma ningn producto que contenga nicotina o tabaco, como cigarrillos y cigarrillos electrnicos. Si necesita ayuda  para dejar de fumar, consulte al mdico.  No use hierbas medicinales ni medicamentos que no le hayan recetado. Estas sustancias qumicas afectan la formacin y el desarrollo del beb.  No se haga duchas vaginales ni use tampones o toallas higinicas perfumadas.  No mantenga las piernas cruzadas durante largos periodos de tiempo.  Para prepararse para la llegada de su beb: ? Tome clases prenatales para entender, practicar, y hacer preguntas sobre el trabajo de parto y el parto. ? Haga un ensayo de la partida al hospital. ? Visite el hospital y recorra el rea de maternidad. ? Pida un permiso de maternidad o paternidad a sus empleadores. ? Organice para que algn familiar o amigo cuide a sus mascotas mientras usted est en el hospital. ? Compre un asiento de seguridad orientado hacia atrs, y asegrese de saber cmo instalarlo en su automvil. ? Prepare el bolso que llevar al hospital. ? Prepare la habitacin del beb. Asegrese de quitar todas las almohadas y   animales de peluche de la cuna del beb para evitar la asfixia.  Visite a su dentista si no lo ha hecho durante el embarazo. Use un cepillo de dientes blando para higienizarse los dientes y psese el hilo dental con suavidad. Comunquese con un mdico si:  No est segura de que est en trabajo de parto o de que ha roto la bolsa de las aguas.  Se siente mareada.  Siente clicos leves, presin en la pelvis o dolor persistente en el abdomen.  Siente dolor en la parte inferior de la espalda.  Tiene nuseas, vmitos o diarrea persistentes.  Observa una secrecin vaginal inusual o con mal olor.  Siente dolor al orinar. Solicite ayuda de inmediato si:  Rompe la bolsa de las aguas antes de la semana 37.  Tiene contracciones regulares en intervalos de menos de 5 minutos antes de la semana 37.  Tiene fiebre.  Tiene una prdida de lquido por la vagina.  Tiene sangrado o pequeas prdidas vaginales.  Tiene dolor o clicos  abdominales intensos.  Baja de peso o sube de peso rpidamente.  Tiene dificultad para respirar y siente dolor de pecho.  Sbitamente se le hinchan mucho el rostro, las manos, los tobillos, los pies o las piernas.  Su beb se mueve menos de 10 veces en 2 horas.  Siente un dolor de cabeza intenso que no se alivia al tomar medicamentos.  Nota cambios en la visin. Resumen  El tercer trimestre comprende desde la semana28 hasta la semana40, es decir, desde el mes7 hasta el mes9. El tercer trimestre es un perodo en el que el beb en gestacin (feto) crece rpidamente.  Durante el tercer trimestre, su incomodidad puede aumentar a medida que usted y su beb continan aumentando de peso. Es posible que tenga dolor abdominal, en las piernas y en la espalda, problemas para dormir y una mayor necesidad de orinar.  Durante el tercer trimestre, sus pechos seguirn creciendo y se pondrn cada vez ms sensibles. Un lquido amarillo (calostro) puede salir de sus pechos. Esta es la primera leche que usted produce para su beb.  El falso trabajo de parto es una afeccin en la que se sienten pequeos e irregulares espasmos de los msculos del tero (contracciones) que a la larga desaparecen. Estas contracciones se llaman contracciones de Braxton Hicks. Las contracciones pueden durar horas, das o incluso semanas, antes de que el verdadero trabajo de parto se inicie.  Los signos del trabajo de parto pueden incluir: calambres abdominales; contracciones regulares que comienzan en intervalos de 10 minutos y se vuelven ms fuertes y ms frecuentes con el tiempo; una secrecin de mucosidad acuosa o con sangre que sale de la vagina; aumento de la presin en la pelvis y dolor latente en la espalda; y prdida de lquido amnitico. Esta informacin no tiene como fin reemplazar el consejo del mdico. Asegrese de hacerle al mdico cualquier pregunta que tenga. Document Revised: 02/17/2017 Document Reviewed:  02/17/2017 Elsevier Patient Education  2020 [employer]   Eleccin del mtodo anticonceptivo Contraception Choices La anticoncepcin, o los mtodos anticonceptivos, hace referencia a los mtodos o dispositivos que evitan el embarazo. Mtodos hormonales Implante anticonceptivo  Un implante anticonceptivo consiste en un tubo plstico delgado que contiene una hormona. Se inserta en la parte superior del brazo. Puede permanecer en el lugar hasta por 3 aos. Inyecciones de progestina sola Las inyecciones de progestina sola contienen progestina, una forma sinttica de la hormona progesterona. Un mdico las administra cada 3 meses. Pldoras anticonceptivas    Las pldoras anticonceptivas son pastillas que contienen hormonas que evitan el embarazo. Deben tomarse una vez al da, preferentemente a la misma hora cada da. Parches anticonceptivos  El parche anticonceptivo contiene hormonas que evitan el embarazo. Se coloca en la piel, debe cambiarse una vez a la semana durante tres semanas y debe retirarse en la cuarta semana. Se necesita una receta para utilizar este mtodo anticonceptivo. Anillo vaginal  Un anillo vaginal contiene hormonas que evitan el embarazo. Se coloca en la vagina durante tres semanas y se retira en la cuarta semana. Luego se repite el proceso con un anillo nuevo. Se necesita una receta para utilizar este mtodo anticonceptivo. Anticonceptivo de emergencia Los anticonceptivos de emergencia son mtodos para evitar un embarazo despus de tener sexo sin proteccin. Vienen en forma de pldora y pueden tomarse hasta 5 das despus de tener sexo. Funcionan mejor cuando se toman lo ms pronto posible luego de tener sexo. La mayora de los anticonceptivos de emergencia estn disponibles sin receta mdica. Este mtodo no debe utilizarse como el nico mtodo anticonceptivo. Mtodos de barrera Preservativo masculino  Un preservativo masculino es una vaina delgada que se coloca sobre el  pene durante el sexo. Los preservativos evitan que el esperma ingrese en el cuerpo de la mujer. Pueden utilizarse con un espermicida para aumentar la efectividad. Deben desecharse luego de su uso. Preservativo femenino  Un preservativo femenino es una vaina blanda y holgada que se coloca en la vagina antes de tener sexo. El preservativo evita que el esperma ingrese en el cuerpo de la mujer. Deben desecharse luego de su uso. Diafragma  Un diafragma es una barrera blanda con forma de cpula. Se inserta en la vagina antes del sexo, junto con un espermicida. El diafragma bloquea el ingreso de esperma en el tero, y el espermicida mata a los espermatozoides. El diafragma debe permanecer en la vagina durante 6 a 8 horas despus de tener sexo y debe retirarse en el plazo de las 24 horas. Un diafragma es recetado y colocado por un mdico. Debe reemplazarse cada 1 a 2 aos, despus de dar a luz, de aumentar ms de 15lb (6,8kg) y de una ciruga plvica. Capuchn cervical  Un capuchn cervical es una copa redonda y blanda de ltex o plstico que se coloca en el cuello uterino. Se inserta en la vagina antes del sexo, junto con un espermicida. Bloquea el ingreso del esperma en el tero. El capuchn debe permanecer en el lugar durante 6 a 8 horas despus de tener sexo y debe retirarse en el plazo de las 48 horas. Un capuchn cervical debe ser recetado y colocado por un mdico. Debe reemplazarse cada 2aos. Esponja  Una esponja es una pieza blanda y circular de espuma de poliuretano que contiene espermicida. La esponja ayuda a bloquear el ingreso de esperma en el tero, y el espermicida mata a los espermatozoides. Para utilizarla, debe humedecerla e insertarla en la vagina. Debe insertarse antes de tener sexo, debe permanecer dentro al menos durante 6 horas despus de tener sexo y debe retirarse y desecharse en el plazo de las 30 horas. Espermicidas Los espermicidas son sustancias qumicas que matan o bloquean al  esperma y no lo dejan ingresar al cuello uterino y al tero. Vienen en forma de crema, gel, supositorio, espuma o comprimido. Un espermicida debe insertarse en la vagina con un aplicador al menos 10 o 15 minutos antes de tener sexo para dar tiempo a que surta efecto. El proceso debe repetirse cada vez que   tenga sexo. Los espermicidas no requieren receta mdica. Anticonceptivos intrauterinos Dispositivo intrauterino (DIU). Un DIU es un dispositivo en forma de T que se coloca en el tero. Existen dos tipos:  DIU hormonal.Este tipo contiene progestina, una forma sinttica de la hormona progesterona. Este tipo puede permanecer colocado durante 3 a 5 aos.  DIU de cobre.Este tipo est recubierto con un alambre de cobre. Puede permanecer colocado durante 10 aos.  Mtodos anticonceptivos permanentes Ligadura de trompas en la mujer En este mtodo, se sellan, atan u obstruyen las trompas de Falopio durante una ciruga para evitar que el vulo descienda hacia el tero. Esterilizacin histeroscpica En este mtodo, se coloca un implante pequeo y flexible dentro de cada trompa de Falopio. Los implantes hacen que se forme un tejido cicatricial en las trompas de Falopio y que las obstruya para que el espermatozoide no pueda llegar al vulo. El procedimiento demora alrededor de 3 meses para que sea efectivo. Debe utilizarse otro mtodo anticonceptivo durante esos 3 meses. Esterilizacin masculina Este es un procedimiento que consiste en atar los conductos que transportan el esperma (vasectoma). Luego del procedimiento, el hombre puede eyacular lquido (semen). Mtodos de planificacin natural Planificacin familiar natural En este mtodo, la pareja no tiene sexo durante los das en que la mujer podra quedar embarazada. Mtodo calendario Esto significa realizar un seguimiento de la duracin de cada ciclo menstrual, identificar los das en los que se puede producir un embarazo y no tener sexo durante esos  das. Mtodo de la ovulacin En este mtodo, la pareja evita tener sexo durante la ovulacin. Mtodo sintotrmico Este mtodo implica no tener sexo durante la ovulacin. Normalmente, la mujer comprueba la ovulacin al observar cambios en su temperatura y en la consistencia del moco cervical. Mtodo posovulacin En este mtodo, la pareja espera a que finalice la ovulacin para tener sexo. Resumen  La anticoncepcin, o los mtodos anticonceptivos, hace referencia a los mtodos o dispositivos que evitan el embarazo.  Los mtodos anticonceptivos hormonales incluyen implantes, inyecciones, pastillas, parches, anillos vaginales y anticonceptivos de emergencia.  Los mtodos anticonceptivos de barrera pueden incluir preservativos masculinos, preservativos femeninos, diafragmas, capuchones cervicales, esponjas y espermicidas.  Existen dos tipos de DIU (dispositivo intrauterino). Un DIU puede colocarse en el tero de una mujer para evitar el embarazo durante 3 a 5 aos.  La esterilizacin permanente puede realizarse mediante un procedimiento para hombres, mujeres o ambos.  Los mtodos de planificacin familiar natural incluyen no tener sexo durante los das en que la mujer podra quedar embarazada. Esta informacin no tiene como fin reemplazar el consejo del mdico. Asegrese de hacerle al mdico cualquier pregunta que tenga. Document Revised: 11/07/2017 Document Reviewed: 01/26/2017 Elsevier Patient Education  2020 [employer]   Lactancia materna Breastfeeding  Decidir amamantar es una de las mejores elecciones que puede hacer por usted y su beb. Un cambio en las hormonas durante el embarazo hace que las mamas produzcan leche materna en las glndulas productoras de leche. Las hormonas impiden que la leche materna sea liberada antes del nacimiento del beb. Adems, impulsan el flujo de leche luego del nacimiento. Una vez que ha comenzado a amamantar, pensar en el beb, as como la succin o el  llanto, pueden estimular la liberacin de leche de las glndulas productoras de leche. Los beneficios de amamantar Las investigaciones demuestran que la lactancia materna ofrece muchos beneficios de salud para bebs y madres. Adems, ofrece una forma gratuita y conveniente de alimentar al beb. Para el beb  La primera leche (calostro)   ayuda a mejorar el funcionamiento del aparato digestivo del beb.  Las clulas especiales de la leche (anticuerpos) ayudan a combatir las infecciones en el beb.  Los bebs que se alimentan con leche materna tambin tienen menos probabilidades de tener asma, alergias, obesidad o diabetes de tipo 2. Adems, tienen menor riesgo de sufrir el sndrome de muerte sbita del lactante (SMSL).  Los nutrientes de la leche materna son mejores para satisfacer las necesidades del beb en comparacin con la leche maternizada.  La leche materna mejora el desarrollo cerebral del beb. Para usted  La lactancia materna favorece el desarrollo de un vnculo muy especial entre la madre y el beb.  Es conveniente. La leche materna es econmica y siempre est disponible a la temperatura correcta.  La lactancia materna ayuda a quemar caloras. Le ayuda a perder el peso ganado durante el embarazo.  Hace que el tero vuelva al tamao que tena antes del embarazo ms rpido. Adems, disminuye el sangrado (loquios) despus del parto.  La lactancia materna contribuye a reducir el riesgo de tener diabetes de tipo 2, osteoporosis, artritis reumatoide, enfermedades cardiovasculares y cncer de mama, ovario, tero y endometrio en el futuro. Informacin bsica sobre la lactancia Comienzo de la lactancia  Encuentre un lugar cmodo para sentarse o acostarse, con un buen respaldo para el cuello y la espalda.  Coloque una almohada o una manta enrollada debajo del beb para acomodarlo a la altura de la mama (si est sentada). Las almohadas para amamantar se han diseado especialmente a fin de  servir de apoyo para los brazos y el beb mientras amamanta.  Asegrese de que la barriga del beb (abdomen) est frente a la suya.  Masajee suavemente la mama. Con las yemas de los dedos, masajee los bordes exteriores de la mama hacia adentro, en direccin al pezn. Esto estimula el flujo de leche. Si la leche fluye lentamente, es posible que deba continuar con este movimiento durante la lactancia.  Sostenga la mama con 4 dedos por debajo y el pulgar por arriba del pezn (forme la letra "C" con la mano). Asegrese de que los dedos se encuentren lejos del pezn y de la boca del beb.  Empuje suavemente los labios del beb con el pezn o con el dedo.  Cuando la boca del beb se abra lo suficiente, acrquelo rpidamente a la mama e introduzca todo el pezn y la arola, tanto como sea posible, dentro de la boca del beb. La arola es la zona de color que rodea al pezn. ? Debe haber ms arola visible por arriba del labio superior del beb que por debajo del labio inferior. ? Los labios del beb deben estar abiertos y extendidos hacia afuera (evertidos) para asegurar que el beb se prenda de forma adecuada y cmoda. ? La lengua del beb debe estar entre la enca inferior y la mama.  Asegrese de que la boca del beb est en la posicin correcta alrededor del pezn (prendido). Los labios del beb deben crear un sello sobre la mama y estar doblados hacia afuera (invertidos).  Es comn que el beb succione durante 2 a 3 minutos para que comience el flujo de leche materna. Cmo debe prenderse Es muy importante que le ensee al beb cmo prenderse adecuadamente a la mama. Si el beb no se prende adecuadamente, puede causar dolor en los pezones, reducir la produccin de leche materna y hacer que el beb tenga un escaso aumento de peso. Adems, si el beb no se prende adecuadamente al   pezn, puede tragar aire durante la alimentacin. Esto puede causarle molestias al beb. Hacer eructar al beb al cambiar de  mama puede ayudarlo a liberar el aire. Sin embargo, ensearle al beb cmo prenderse a la mama adecuadamente es la mejor manera de evitar que se sienta molesto por tragar aire mientras se alimenta. Signos de que el beb se ha prendido adecuadamente al pezn  Tironea o succiona de modo silencioso, sin causarle dolor. Los labios del beb deben estar extendidos hacia afuera (evertidos).  Se escucha que traga cada 3 o 4 succiones una vez que la leche ha comenzado a fluir (despus de que se produzca el reflejo de eyeccin de la leche).  Hay movimientos musculares por arriba y por delante de sus odos al succionar. Signos de que el beb no se ha prendido adecuadamente al pezn  Hace ruidos de succin o de chasquido mientras se alimenta.  Siente dolor en los pezones. Si cree que el beb no se prendi correctamente, deslice el dedo en la comisura de la boca y colquelo entre las encas del beb para interrumpir la succin. Intente volver a comenzar a amamantar. Signos de lactancia materna exitosa Signos del beb  El beb disminuir gradualmente el nmero de succiones o dejar de succionar por completo.  El beb se quedar dormido.  El cuerpo del beb se relajar.  El beb retendr una pequea cantidad de leche en la boca.  El beb se desprender solo del pecho. Signos que presenta usted  Las mamas han aumentado la firmeza, el peso y el tamao 1 a 3 horas despus de amamantar.  Estn ms blandas inmediatamente despus de amamantar.  Se producen un aumento del volumen de leche y un cambio en su consistencia y color hacia el quinto da de lactancia.  Los pezones no duelen, no estn agrietados ni sangran. Signos de que su beb recibe la cantidad de leche suficiente  Mojar por lo menos 1 o 2paales durante las primeras 24horas despus del nacimiento.  Mojar por lo menos 5 o 6paales cada 24horas durante la primera semana despus del nacimiento. La orina debe ser clara o de color  amarillo plido a los 5das de vida.  Mojar entre 6 y 8paales cada 24horas a medida que el beb sigue creciendo y desarrollndose.  Defeca por lo menos 3 veces en 24 horas a los 5 das de vida. Las heces deben ser blandas y amarillentas.  Defeca por lo menos 3 veces en 24 horas a los 7 das de vida. Las heces deben ser grumosas y amarillentas.  No registra una prdida de peso mayor al 10% del peso al nacer durante los primeros 3 das de vida.  Aumenta de peso un promedio de 4 a 7onzas (113 a 198g) por semana despus de los 4 das de vida.  Aumenta de peso, diariamente, de manera uniforme a partir de los 5 das de vida, sin registrar prdida de peso despus de las 2semanas de vida. Despus de alimentarse, es posible que el beb regurgite una pequea cantidad de leche. Esto es normal. Frecuencia y duracin de la lactancia El amamantamiento frecuente la ayudar a producir ms leche y puede prevenir dolores en los pezones y las mamas extremadamente llenas (congestin mamaria). Alimente al beb cuando muestre signos de hambre o si siente la necesidad de reducir la congestin de las mamas. Esto se denomina "lactancia a demanda". Las seales de que el beb tiene hambre incluyen las siguientes:  Aumento del estado de alerta, actividad o inquietud.    Mueve la cabeza de un lado a otro.  Abre la boca cuando se le toca la mejilla o la comisura de la boca (reflejo de bsqueda).  Aumenta las vocalizaciones, tales como sonidos de succin, se relame los labios, emite arrullos, suspiros o chirridos.  Mueve la mano hacia la boca y se chupa los dedos o las manos.  Est molesto o llora. Evite el uso del chupete en las primeras 4 a 6 semanas despus del nacimiento del beb. Despus de este perodo, podr usar un chupete. Las investigaciones demostraron que el uso del chupete durante el primer ao de vida del beb disminuye el riesgo de tener el sndrome de muerte sbita del lactante (SMSL). Permita  que el nio se alimente en cada mama todo lo que desee. Cuando el beb se desprende o se queda dormido mientras se est alimentando de la primera mama, ofrzcale la segunda. Debido a que, con frecuencia, los recin nacidos estn somnolientos las primeras semanas de vida, es posible que deba despertar al beb para alimentarlo. Los horarios de lactancia varan de un beb a otro. Sin embargo, las siguientes reglas pueden servir como gua para ayudarla a garantizar que el beb se alimenta adecuadamente:  Se puede amamantar a los recin nacidos (bebs de 4 semanas o menos de vida) cada 1 a 3 horas.  No deben transcurrir ms de 3 horas durante el da o 5 horas durante la noche sin que se amamante a los recin nacidos.  Debe amamantar al beb un mnimo de 8 veces en un perodo de 24 horas. Extraccin de leche materna     La extraccin y el almacenamiento de la leche materna le permiten asegurarse de que el beb se alimente exclusivamente de su leche materna, aun en momentos en los que no puede amamantar. Esto tiene especial importancia si debe regresar al trabajo en el perodo en que an est amamantando o si no puede estar presente en los momentos en que el beb debe alimentarse. Su asesor en lactancia puede ayudarla a encontrar un mtodo de extraccin que funcione mejor para usted y orientarla sobre cunto tiempo es seguro almacenar leche materna. Cmo cuidar las mamas durante la lactancia Los pezones pueden secarse, agrietarse y doler durante la lactancia. Las siguientes recomendaciones pueden ayudarla a mantener las mamas humectadas y sanas:  Evite usar jabn en los pezones.  Use un sostn de soporte diseado especialmente para la lactancia materna. Evite usar sostenes con aro o sostenes muy ajustados (sostenes deportivos).  Seque al aire sus pezones durante 3 a 4minutos despus de amamantar al beb.  Utilice solo apsitos de algodn en el sostn para absorber las prdidas de leche. La prdida de  un poco de leche materna entre las tomas es normal.  Utilice lanolina sobre los pezones luego de amamantar. La lanolina ayuda a mantener la humedad normal de la piel. La lanolina pura no es perjudicial (no es txica) para el beb. Adems, puede extraer manualmente algunas gotas de leche materna y masajear suavemente esa leche sobre los pezones para que la leche se seque al aire. Durante las primeras semanas despus del nacimiento, algunas mujeres experimentan congestin mamaria. La congestin mamaria puede hacer que sienta las mamas pesadas, calientes y sensibles al tacto. El pico de la congestin mamaria ocurre en el plazo de los 3 a 5 das despus del parto. Las siguientes recomendaciones pueden ayudarla a aliviar la congestin mamaria:  Vace por completo las mamas al amamantar o extraer leche. Puede aplicar calor hmedo en las   mamas (en la ducha o con toallas hmedas para manos) antes de amamantar o extraer leche. Esto aumenta la circulacin y ayuda a que la leche fluya. Si el beb no vaca por completo las mamas cuando lo amamanta, extraiga la leche restante despus de que haya finalizado.  Aplique compresas de hielo sobre las mamas inmediatamente despus de amamantar o extraer leche, a menos que le resulte demasiado incmodo. Haga lo siguiente: ? Ponga el hielo en una bolsa plstica. ? Coloque una toalla entre la piel y la bolsa de hielo. ? Coloque el hielo durante 20minutos, 2 o 3veces por da.  Asegrese de que el beb est prendido y se encuentre en la posicin correcta mientras lo alimenta. Si la congestin mamaria persiste luego de 48 horas o despus de seguir estas recomendaciones, comunquese con su mdico o un asesor en lactancia. Recomendaciones de salud general durante la lactancia  Consuma 3 comidas y 3 colaciones saludables todos los das. Las madres bien alimentadas que amamantan necesitan entre 450 y 500 caloras adicionales por da. Puede cumplir con este requisito al aumentar  la cantidad de una dieta equilibrada que realice.  Beba suficiente agua para mantener la orina clara o de color amarillo plido.  Descanse con frecuencia, reljese y siga tomando sus vitaminas prenatales para prevenir la fatiga, el estrs y los niveles bajos de vitaminas y minerales en el cuerpo (deficiencias de nutrientes).  No consuma ningn producto que contenga nicotina o tabaco, como cigarrillos y cigarrillos electrnicos. El beb puede verse afectado por las sustancias qumicas de los cigarrillos que pasan a la leche materna y por la exposicin al humo ambiental del tabaco. Si necesita ayuda para dejar de fumar, consulte al mdico.  Evite el consumo de alcohol.  No consuma drogas ilegales o marihuana.  Antes de usar cualquier medicamento, hable con el mdico. Estos incluyen medicamentos recetados y de venta libre, como tambin vitaminas y suplementos a base de hierbas. Algunos medicamentos, que pueden ser perjudiciales para el beb, pueden pasar a travs de la leche materna.  Puede quedar embarazada durante la lactancia. Si se desea un mtodo anticonceptivo, consulte al mdico sobre cules son las opciones seguras durante la lactancia. Dnde encontrar ms informacin: Liga internacional La Leche: www.llli.org. Comunquese con un mdico si:  Siente que quiere dejar de amamantar o se siente frustrada con la lactancia.  Sus pezones estn agrietados o sangran.  Sus mamas estn irritadas, sensibles o calientes.  Tiene los siguientes sntomas: ? Dolor en las mamas o en los pezones. ? Un rea hinchada en cualquiera de las mamas. ? Fiebre o escalofros. ? Nuseas o vmitos. ? Drenaje de otro lquido distinto de la leche materna desde los pezones.  Sus mamas no se llenan antes de amamantar al beb para el quinto da despus del parto.  Se siente triste y deprimida.  El beb: ? Est demasiado somnoliento como para comer bien. ? Tiene problemas para dormir. ? Tiene ms de 1 semana  de vida y moja menos de 6 paales en un periodo de 24 horas. ? No ha aumentado de peso a los 5 das de vida.  El beb defeca menos de 3 veces en 24 horas.  La piel del beb o las partes blancas de los ojos se vuelven amarillentas. Solicite ayuda de inmediato si:  El beb est muy cansado (letargo) y no se quiere despertar para comer.  Le sube la fiebre sin causa. Resumen  La lactancia materna ofrece muchos beneficios de salud para bebs y madres.    Intente amamantar a su beb cuando muestre signos tempranos de hambre.  Haga cosquillas o empuje suavemente los labios del beb con el dedo o el pezn para lograr que el beb abra la boca. Acerque el beb a la mama. Asegrese de que la mayor parte de la arola se encuentre dentro de la boca del beb. Ofrzcale una mama y haga eructar al beb antes de pasar a la otra.  Hable con su mdico o asesor en lactancia si tiene dudas o problemas con la lactancia. Esta informacin no tiene como fin reemplazar el consejo del mdico. Asegrese de hacerle al mdico cualquier pregunta que tenga. Document Revised: 12/31/2017 Document Reviewed: 01/26/2017 Elsevier Patient Education  2020 Elsevier Inc.  

## 2020-10-17 LAB — POCT URINALYSIS DIP (DEVICE)
Glucose, UA: NEGATIVE mg/dL
Hgb urine dipstick: NEGATIVE
Leukocytes,Ua: NEGATIVE
Nitrite: NEGATIVE
Protein, ur: 30 mg/dL — AB
Specific Gravity, Urine: >=1.03 (ref 1.005–1.030)
Urobilinogen, UA: 0.2 mg/dL (ref 0.0–1.0)
pH: 6 (ref 5.0–8.0)

## 2020-10-23 ENCOUNTER — Other Ambulatory Visit: Payer: Self-pay | Admitting: *Deleted

## 2020-10-23 ENCOUNTER — Encounter: Payer: Self-pay | Admitting: *Deleted

## 2020-10-23 ENCOUNTER — Ambulatory Visit: Payer: Self-pay | Admitting: *Deleted

## 2020-10-23 ENCOUNTER — Ambulatory Visit: Payer: Self-pay | Attending: Obstetrics and Gynecology

## 2020-10-23 ENCOUNTER — Other Ambulatory Visit: Payer: Self-pay

## 2020-10-23 DIAGNOSIS — O0990 Supervision of high risk pregnancy, unspecified, unspecified trimester: Secondary | ICD-10-CM | POA: Insufficient documentation

## 2020-10-23 DIAGNOSIS — Z3A32 32 weeks gestation of pregnancy: Secondary | ICD-10-CM

## 2020-10-23 DIAGNOSIS — O24410 Gestational diabetes mellitus in pregnancy, diet controlled: Secondary | ICD-10-CM

## 2020-10-23 DIAGNOSIS — O368330 Maternal care for abnormalities of the fetal heart rate or rhythm, third trimester, not applicable or unspecified: Secondary | ICD-10-CM

## 2020-10-30 ENCOUNTER — Ambulatory Visit (INDEPENDENT_AMBULATORY_CARE_PROVIDER_SITE_OTHER): Payer: Self-pay | Admitting: Advanced Practice Midwife

## 2020-10-30 VITALS — BP 114/79 | HR 79 | Wt 143.8 lb

## 2020-10-30 DIAGNOSIS — O0990 Supervision of high risk pregnancy, unspecified, unspecified trimester: Secondary | ICD-10-CM

## 2020-10-30 DIAGNOSIS — Z3A33 33 weeks gestation of pregnancy: Secondary | ICD-10-CM

## 2020-10-30 DIAGNOSIS — O24410 Gestational diabetes mellitus in pregnancy, diet controlled: Secondary | ICD-10-CM

## 2020-10-30 DIAGNOSIS — Z789 Other specified health status: Secondary | ICD-10-CM

## 2020-10-30 NOTE — Patient Instructions (Signed)
Evaluación de los movimientos fetales  Fetal Movement Counts  Nombre del paciente: ________________________________________________ Fecha de parto estimada: ____________________  ¿Qué es una evaluación de los movimientos fetales?    Una evaluación de los movimientos fetales es el registro del número de veces que siente que el bebé se mueve durante un cierto período de tiempo. Esto también se puede denominar recuento de patadas fetales. Una evaluación de movimientos fetales se recomienda a todas las embarazadas. Es posible que le indiquen que comience a evaluar los movimientos fetales desde la semana 28 de embarazo.  Preste atención cuando sienta que el bebé está más activo. Podrá detectar los ciclos en que el bebé duerme y está despierto. También podrá detectar que ciertas cosas hacen que su bebé se mueva más. Deberá realizar una evaluación de los movimientos fetales en las siguientes situaciones:  · Cuando el bebé está más activo habitualmente.  · A la misma hora, todos los días.  Un buen momento para evaluar los movimientos fetales es cuando está descansando, después de haber comido y bebido algo.  ¿Cómo debo contar los movimientos fetales?  1. Encuentre un lugar tranquilo y cómodo. Siéntese o acuéstese de lado.  2. Anote la fecha, la hora de inicio y de finalización y la cantidad de movimientos que sintió entre esas dos horas. Lleve esta información a las visitas de control.  3. Anote la hora de inicio cuando sienta el primer movimiento.  4. Cuente las pataditas, revoloteos, chasquidos, vueltas o pinchazos. Debe sentir al menos 10 movimientos.  5. Puede dejar de contar después de haber sentido 10 movimientos o de haber contado durante 2 horas. Anote la hora de finalización.  6. Si no siente 10 movimientos en 2 horas, comuníquese con su médico para obtener más indicaciones. Es posible que el médico quiera realizar estudios adicionales para evaluar el bienestar del bebé.  Comuníquese con un médico si:  · Siente  menos de 10 movimientos en 2 horas.  · El bebé no se mueve tanto como suele hacerlo.  Fecha: ____________ Hora de inicio: ____________ Hora de finalización: ____________ Movimientos: ____________  Fecha: ____________ Hora de inicio: ____________ Hora de finalización: ____________ Movimientos: ____________  Fecha: ____________ Hora de inicio: ____________ Hora de finalización: ____________ Movimientos: ____________  Fecha: ____________ Hora de inicio: ____________ Hora de finalización: ____________ Movimientos: ____________  Fecha: ____________ Hora de inicio: ____________ Hora de finalización: ____________ Movimientos: ____________  Fecha: ____________ Hora de inicio: ____________ Hora de finalización: ____________ Movimientos: ____________  Fecha: ____________ Hora de inicio: ____________ Hora de finalización: ____________ Movimientos: ____________  Fecha: ____________ Hora de inicio: ____________ Hora de finalización: ____________ Movimientos: ____________  Fecha: ____________ Hora de inicio: ____________ Hora de finalización: ____________ Movimientos: ____________  Esta información no tiene como fin reemplazar el consejo del médico. Asegúrese de hacerle al médico cualquier pregunta que tenga.  Document Revised: 08/02/2019 Document Reviewed: 08/02/2019  Elsevier Patient Education © 2021 Elsevier Inc.

## 2020-10-31 LAB — POCT URINALYSIS DIP (DEVICE)
Bilirubin Urine: NEGATIVE
Glucose, UA: NEGATIVE mg/dL
Hgb urine dipstick: NEGATIVE
Ketones, ur: NEGATIVE mg/dL
Leukocytes,Ua: NEGATIVE
Nitrite: NEGATIVE
Protein, ur: 30 mg/dL — AB
Specific Gravity, Urine: 1.02 (ref 1.005–1.030)
Urobilinogen, UA: 1 mg/dL (ref 0.0–1.0)
pH: 8.5 — ABNORMAL HIGH (ref 5.0–8.0)

## 2020-10-31 NOTE — Progress Notes (Signed)
   PRENATAL VISIT NOTE  Subjective:  Samantha Conner is a 35 y.o. G2P0010 at [redacted]w[redacted]d being seen today for ongoing prenatal care.  She is currently monitored for the following issues for this high-risk pregnancy and has Supervision of high risk pregnancy, antepartum; Gestational diabetes mellitus (GDM), antepartum; Language barrier; and [redacted] weeks gestation of pregnancy on their problem list.  Patient reports one brief episode of cramping which resolved with rest and hydration.  Contractions: Irritability. Vag. Bleeding: None.  Movement: Present. Denies leaking of fluid.   The following portions of the patient's history were reviewed and updated as appropriate: allergies, current medications, past family history, past medical history, past social history, past surgical history and problem list. Problem list updated.  Objective:   Vitals:   10/30/20 1508  BP: 114/79  Pulse: 79  Weight: 143 lb 12.8 oz (65.2 kg)    Fetal Status: Fetal Heart Rate (bpm): 152 Fundal Height: 33 cm Movement: Present     General:  Alert, oriented and cooperative. Patient is in no acute distress.  Skin: Skin is warm and dry. No rash noted.   Cardiovascular: Normal heart rate noted  Respiratory: Normal respiratory effort, no problems with respiration noted  Abdomen: Soft, gravid, appropriate for gestational age.  Pain/Pressure: Present     Pelvic: Cervical exam deferred        Extremities: Normal range of motion.  Edema: None  Mental Status: Normal mood and affect. Normal behavior. Normal judgment and thought content.   Assessment and Plan:  Pregnancy: G2P0010 at [redacted]w[redacted]d  1. Supervision of high risk pregnancy, antepartum - No acute concerns - Advised daily kick counts, interventions for low kick number, indications for evaluation in MAU  2. Diet controlled gestational diabetes mellitus (GDM), antepartum - All values since previous visit within range, paper sheet scanned and visible in Media tab - Serial  evaluation with MFM already coordinated  3. Language barrier - Spanish language interpreter present for all patient interaction  4. [redacted] weeks gestation of pregnancy   Preterm labor symptoms and general obstetric precautions including but not limited to vaginal bleeding, contractions, leaking of fluid and fetal movement were reviewed in detail with the patient. Please refer to After Visit Summary for other counseling recommendations.  Return in about 2 weeks (around 11/13/2020).  Future Appointments  Date Time Provider [place]  11/14/2020  8:15 AM Clarnce Flock, MD [facility]  11/20/2020  9:00 AM [facility] NURSE [facility]  11/20/2020  9:15 AM [facility] US2 [facility] [place]    Darlina Rumpf, CNM

## 2020-11-14 ENCOUNTER — Other Ambulatory Visit: Payer: Self-pay

## 2020-11-14 ENCOUNTER — Ambulatory Visit (INDEPENDENT_AMBULATORY_CARE_PROVIDER_SITE_OTHER): Payer: Self-pay | Admitting: Family Medicine

## 2020-11-14 ENCOUNTER — Encounter: Payer: Self-pay | Admitting: Family Medicine

## 2020-11-14 ENCOUNTER — Other Ambulatory Visit (HOSPITAL_COMMUNITY)
Admission: RE | Admit: 2020-11-14 | Discharge: 2020-11-14 | Disposition: A | Discharge status: Home / Self Care | Payer: Self-pay | Source: Ambulatory Visit | Attending: Family Medicine | Admitting: Family Medicine

## 2020-11-14 VITALS — BP 111/85 | HR 79 | Wt 144.5 lb

## 2020-11-14 DIAGNOSIS — Z789 Other specified health status: Secondary | ICD-10-CM

## 2020-11-14 DIAGNOSIS — O24410 Gestational diabetes mellitus in pregnancy, diet controlled: Secondary | ICD-10-CM

## 2020-11-14 DIAGNOSIS — O0990 Supervision of high risk pregnancy, unspecified, unspecified trimester: Secondary | ICD-10-CM | POA: Insufficient documentation

## 2020-11-14 NOTE — Progress Notes (Signed)
   Subjective:  Samantha Conner is a 35 y.o. G2P0010 at [redacted]w[redacted]d being seen today for ongoing prenatal care.  She is currently monitored for the following issues for this high-risk pregnancy and has Supervision of high risk pregnancy, antepartum; Gestational diabetes mellitus (GDM), antepartum; Language barrier; and [redacted] weeks gestation of pregnancy on their problem list.  Patient reports no complaints.  Contractions: Not present. Vag. Bleeding: None.  Movement: Present. Denies leaking of fluid.   The following portions of the patient's history were reviewed and updated as appropriate: allergies, current medications, past family history, past medical history, past social history, past surgical history and problem list. Problem list updated.  Objective:   Vitals:   11/14/20 0828  BP: 111/85  Pulse: 79  Weight: 144 lb 8 oz (65.5 kg)    Fetal Status: Fetal Heart Rate (bpm): 125   Movement: Present     General:  Alert, oriented and cooperative. Patient is in no acute distress.  Skin: Skin is warm and dry. No rash noted.   Cardiovascular: Normal heart rate noted  Respiratory: Normal respiratory effort, no problems with respiration noted  Abdomen: Soft, gravid, appropriate for gestational age. Pain/Pressure: Absent     Pelvic: Vag. Bleeding: None     Cervical exam deferred        Extremities: Normal range of motion.  Edema: None  Mental Status: Normal mood and affect. Normal behavior. Normal judgment and thought content.   Urinalysis:      Assessment and Plan:  Pregnancy: G2P0010 at [redacted]w[redacted]d  1. Supervision of high risk pregnancy, antepartum BP and FHR normal Swabs collected today  2. Language barrier Spanish  3. Diet controlled gestational diabetes mellitus (GDM), antepartum Sugars are 100% at goal today Following w MFM, last growth 10/23/2020 showed EFW 2371g, 87%, normal AFI Has f/u scheduled for 11/20/2020  Preterm labor symptoms and general obstetric precautions including but not  limited to vaginal bleeding, contractions, leaking of fluid and fetal movement were reviewed in detail with the patient. Please refer to After Visit Summary for other counseling recommendations.  Return in 1 week (on 11/21/2020) for [facility], ob visit.   Clarnce Flock, MD

## 2020-11-14 NOTE — Patient Instructions (Signed)
Eleccin del mtodo anticonceptivo Contraception Choices La anticoncepcin, o los mtodos anticonceptivos, hace referencia a los mtodos o dispositivos que evitan el [place]. Mtodos hormonales Implante anticonceptivo Un implante anticonceptivo consiste en un tubo delgado de plstico que contiene una hormona que evita el [place]. Es diferente de un dispositivo intrauterino (DIU). Un mdico lo inserta en la parte superior del brazo. Los implantes pueden ser eficaces durante un mximo de 3 aos. Inyecciones de progestina sola Las inyecciones de progestina sola contienen progestina, una forma sinttica de la hormona progesterona. Un mdico las administra cada 3 meses. Pldoras anticonceptivas Las pldoras anticonceptivas son pastillas que contienen hormonas que evitan el [place]. Deben tomarse una vez al da, preferentemente a [place]. Se necesita una receta para utilizar este mtodo anticonceptivo. Parche anticonceptivo El parche anticonceptivo contiene hormonas que evitan el [place]. Se coloca en la piel, debe cambiarse una vez a la semana durante tres semanas y debe retirarse en la cuarta semana. Se necesita una receta para utilizar este mtodo anticonceptivo. Anillo vaginal Un anillo vaginal contiene hormonas que evitan el embarazo. Se coloca en la vagina durante tres semanas y se retira en la cuarta semana. Luego se repite el proceso con un anillo nuevo. Se necesita una receta para utilizar este mtodo anticonceptivo. Anticonceptivo de emergencia Los anticonceptivos de emergencia son mtodos para evitar un embarazo despus de Best boy sexo sin proteccin. Vienen en forma de pldora y pueden tomarse hasta 5 das despus de [place]. Funcionan mejor cuando se toman lo ms pronto posible luego de [employer]. La mayora de los anticonceptivos de emergencia estn disponibles sin receta mdica. Este mtodo no debe utilizarse como el nico mtodo anticonceptivo.   Mtodos de  barrera Condn masculino Un condn masculino es una vaina delgada que se coloca sobre el pene durante el sexo. Los condones evitan que el esperma ingrese en el cuerpo de la [place]. Pueden utilizarse con un una sustancia que mata a los espermatozoides (espermicida) para aumentar la efectividad. Deben desecharse despus de un uso. Condn femenino Un condn femenino es una vaina blanda y holgada que se coloca en la vagina antes de [place]. El condn evita que el esperma ingrese en el cuerpo de la [place]. Deben desecharse despus de un uso. Diafragma Un diafragma es una barrera blanda con forma de cpula. Se inserta en la vagina antes del sexo, junto con un espermicida. El diafragma bloquea el ingreso de esperma en el tero, y el espermicida mata a los espermatozoides. El Designer, television/film set en la vagina durante 6 a 8 horas despus de Best boy sexo y debe retirarse en el plazo de las 24 horas. Un diafragma es recetado y colocado por un mdico. Debe reemplazarse cada 1 a 2 aos, despus de dar a luz, de aumentar ms de 15lb (6.8kg) y de [place] plvica. Capuchn cervical Un capuchn cervical es una copa redonda y blanda de ltex o plstico que se coloca en el cuello uterino. Se inserta en la vagina antes del sexo, junto con un espermicida. Bloquea el ingreso del esperma en el tero. El capuchn Medical illustrator durante 6 a 8 horas despus de Best boy sexo y debe retirarse en el plazo de las 48 horas. Un capuchn cervical debe ser recetado y colocado por un mdico. Debe reemplazarse cada 2aos. Esponja Una esponja es una pieza blanda y circular de espuma de poliuretano que contiene espermicida. La esponja ayuda a bloquear el ingreso de [employer], y [place]  espermicida mata a los espermatozoides. Marylyn Ishihara, debe humedecerla e insertarla en la vagina. Debe insertarse antes de [employer], debe permanecer dentro al menos durante 6 horas despus de tener sexo y debe retirarse y  Aeronautical engineer en el plazo de las 30 horas. Espermicidas Los espermicidas son sustancias qumicas que matan o bloquean al esperma y no lo dejan ingresar al cuello uterino y al tero. Vienen en forma de crema, gel, supositorio, espuma o comprimido. Un espermicida debe insertarse en la vagina con un aplicador al menos 10 o 15 minutos antes de tener sexo para dar tiempo a que surta [place]. El proceso debe repetirse cada vez que tenga sexo. Los espermicidas no requieren Furniture conservator/restorer.   Anticonceptivos intrauterinos Dispositivo intrauterino (DIU) Un DIU es un dispositivo en forma de T que se coloca en el tero. Existen dos tipos:  DIU hormonal.Este tipo contiene progestina, una forma sinttica de la hormona progesterona. Este tipo puede permanecer colocado durante 3 a 5 aos.  DIU de cobre.Este tipo est recubierto con un alambre de cobre. Puede permanecer colocado durante 10 aos. Mtodos anticonceptivos permanentes Ligadura de trompas en la mujer En este mtodo, se sellan, atan u obstruyen las trompas de Falopio durante una ciruga para Product/process development scientist que el vulo descienda [place]. Esterilizacin histeroscpica En este mtodo, se coloca un implante pequeo y flexible dentro de cada trompa de Falopio. Los implantes hacen que se forme un tejido cicatricial en las trompas de Falopio y que las obstruya para que el espermatozoide no pueda llegar al vulo. El procedimiento demora alrededor de 3 meses para que sea [place]. Debe utilizarse otro mtodo anticonceptivo durante esos 3 meses. Esterilizacin masculina Este es un procedimiento que consiste en atar los conductos que transportan el esperma (vasectoma). Luego del procedimiento, el hombre Equities trader lquido (semen). Debe utilizarse otro mtodo anticonceptivo durante 3 meses despus del procedimiento. Mtodos de planificacin natural Planificacin familiar natural En este mtodo, la pareja no tiene [employer] la mujer podra quedar  Campus. Mtodo calendario En este mtodo, la mujer realiza un seguimiento de la duracin de cada ciclo menstrual, identifica los [employer] que se puede producir un Media planner y no tiene sexo durante esos das. Mtodo de la ovulacin En este mtodo, la pareja evita tener sexo durante la ovulacin. Mtodo sintotrmico Este mtodo implica no tener sexo durante la ovulacin. Normalmente, la mujer comprueba la ovulacin al observar cambios en su temperatura y en la consistencia del moco cervical. Mtodo posovulacin En este mtodo, la pareja espera a que finalice la ovulacin para Adult nurse. Dnde buscar ms informacin  Centers for Disease Control and Prevention (Centros para el Control y Publishing copy de Arboriculturist): http://www.wolf.info/ Resumen  La anticoncepcin, o los mtodos anticonceptivos, hace referencia a los mtodos o dispositivos que evitan el [place].  Los mtodos anticonceptivos hormonales incluyen implantes, inyecciones, pastillas, parches, anillos vaginales y anticonceptivos de Freight forwarder.  Los mtodos anticonceptivos de barrera pueden incluir condones masculinos, condones femeninos, diafragmas, capuchones cervicales, esponjas y espermicidas.  [employer] tipos de DIU (dispositivo intrauterino). Un DIU puede colocarse en el tero de una mujer para evitar el embarazo durante 3 a 5 aos.  La esterilizacin permanente puede realizarse mediante un procedimiento tanto en los hombres como en las mujeres. Los NIKE de Marine scientist natural implican no tener [employer] la mujer podra quedar [place]. Esta informacin no tiene Marine scientist el consejo del mdico. Asegrese de hacerle al mdico cualquier pregunta que  tenga. Document Revised: 05/08/2020 Document Reviewed: 05/08/2020 Elsevier Patient Education  2021 Elsevier Inc.   Lactancia materna Breastfeeding  Decidir amamantar es una de las mejores elecciones que puede hacer por usted y su  beb. Un cambio en las hormonas durante el embarazo hace que las mamas produzcan leche materna en las glndulas productoras de leche. Las hormonas impiden que la leche materna sea liberada antes del nacimiento del beb. Adems, impulsan el flujo de leche luego del nacimiento. Una vez que ha comenzado a amamantar, pensar en el beb, as como la succin o el llanto, pueden estimular la liberacin de leche de las glndulas productoras de leche. Los beneficios de amamantar Las investigaciones demuestran que la lactancia materna ofrece muchos beneficios de salud para bebs y madres. Adems, ofrece una forma gratuita y conveniente de alimentar al beb. Para el beb  La primera leche (calostro) ayuda a mejorar el funcionamiento del aparato digestivo del beb.  Las clulas especiales de la leche (anticuerpos) ayudan a combatir las infecciones en el beb.  Los bebs que se alimentan con leche materna tambin tienen menos probabilidades de tener asma, alergias, obesidad o diabetes de tipo 2. Adems, tienen menor riesgo de sufrir el sndrome de muerte sbita del lactante (SMSL).  Los nutrientes de la leche materna son mejores para satisfacer las necesidades del beb en comparacin con la leche maternizada.  La leche materna mejora el desarrollo cerebral del beb. Para usted  La lactancia materna favorece el desarrollo de un vnculo muy especial entre la madre y el beb.  Es conveniente. La leche materna es econmica y siempre est disponible a la temperatura correcta.  La lactancia materna ayuda a quemar caloras. Le ayuda a perder el peso ganado durante el embarazo.  Hace que el tero vuelva al tamao que tena antes del embarazo ms rpido. Adems, disminuye el sangrado (loquios) despus del parto.  La lactancia materna contribuye a reducir el riesgo de tener diabetes de tipo 2, osteoporosis, artritis reumatoide, enfermedades cardiovasculares y cncer de mama, ovario, tero y endometrio en el  futuro. Informacin bsica sobre la lactancia Comienzo de la lactancia  Encuentre un lugar cmodo para sentarse o acostarse, con un buen respaldo para el cuello y la espalda.  Coloque una almohada o una manta enrollada debajo del beb para acomodarlo a la altura de la mama (si est sentada). Las almohadas para amamantar se han diseado especialmente a fin de servir de apoyo para los brazos y el beb mientras amamanta.  Asegrese de que la barriga del beb (abdomen) est frente a la suya.  Masajee suavemente la mama. Con las yemas de los dedos, masajee los bordes exteriores de la mama hacia adentro, en direccin al pezn. Esto estimula el flujo de leche. Si la leche fluye lentamente, es posible que deba continuar con este movimiento durante la lactancia.  Sostenga la mama con 4 dedos por debajo y el pulgar por arriba del pezn (forme la letra "C" con la mano). Asegrese de que los dedos se encuentren lejos del pezn y de la boca del beb.  Empuje suavemente los labios del beb con el pezn o con el dedo.  Cuando la boca del beb se abra lo suficiente, acrquelo rpidamente a la mama e introduzca todo el pezn y la arola, tanto como sea posible, dentro de la boca del beb. La arola es la zona de color que rodea al pezn. ? Debe haber ms arola visible por arriba del labio superior del beb que por debajo del labio   inferior. ? Los labios del beb deben estar abiertos y extendidos hacia afuera (evertidos) para asegurar que el beb se prenda de forma adecuada y cmoda. ? La lengua del beb debe estar entre la enca inferior y la mama.  Asegrese de que la boca del beb est en la posicin correcta alrededor del pezn (prendido). Los labios del beb deben crear un sello sobre la mama y estar doblados hacia afuera (invertidos).  Es comn que el beb succione durante 2 a 3 minutos para que comience el flujo de leche materna. Cmo debe prenderse Es muy importante que le ensee al beb cmo  prenderse adecuadamente a la mama. Si el beb no se prende adecuadamente, puede causar dolor en los pezones, reducir la produccin de leche materna y hacer que el beb tenga un escaso aumento de peso. Adems, si el beb no se prende adecuadamente al pezn, puede tragar aire durante la alimentacin. Esto puede causarle molestias al beb. Hacer eructar al beb al cambiar de mama puede ayudarlo a liberar el aire. Sin embargo, ensearle al beb cmo prenderse a la mama adecuadamente es la mejor manera de evitar que se sienta molesto por tragar aire mientras se alimenta. Signos de que el beb se ha prendido adecuadamente al pezn  Tironea o succiona de modo silencioso, sin causarle dolor. Los labios del beb deben estar extendidos hacia afuera (evertidos).  Se escucha que traga cada 3 o 4 succiones una vez que la leche ha comenzado a fluir (despus de que se produzca el reflejo de eyeccin de la leche).  Hay movimientos musculares por arriba y por delante de sus odos al succionar. Signos de que el beb no se ha prendido adecuadamente al pezn  Hace ruidos de succin o de chasquido mientras se alimenta.  Siente dolor en los pezones. Si cree que el beb no se prendi correctamente, deslice el dedo en la comisura de la boca y colquelo entre las encas del beb para interrumpir la succin. Intente volver a comenzar a amamantar. Signos de lactancia materna exitosa Signos del beb  El beb disminuir gradualmente el nmero de succiones o dejar de succionar por completo.  El beb se quedar dormido.  El cuerpo del beb se relajar.  El beb retendr una pequea cantidad de leche en la boca.  El beb se desprender solo del pecho. Signos que presenta usted  Las mamas han aumentado la firmeza, el peso y el tamao 1 a 3 horas despus de amamantar.  Estn ms blandas inmediatamente despus de amamantar.  Se producen un aumento del volumen de leche y un cambio en su consistencia y color hacia el  quinto da de lactancia.  Los pezones no duelen, no estn agrietados ni sangran. Signos de que su beb recibe la cantidad de leche suficiente  Mojar por lo menos 1 o 2paales durante las primeras 24horas despus del nacimiento.  Mojar por lo menos 5 o 6paales cada 24horas durante la primera semana despus del nacimiento. La orina debe ser clara o de color amarillo plido a los 5das de vida.  Mojar entre 6 y 8paales cada 24horas a medida que el beb sigue creciendo y desarrollndose.  Defeca por lo menos 3 veces en 24 horas a los 5 das de vida. Las heces deben ser blandas y amarillentas.  Defeca por lo menos 3 veces en 24 horas a los 7 das de vida. Las heces deben ser grumosas y amarillentas.  No registra una prdida de peso mayor al 10% del peso al   nacer durante los primeros 3 das de vida.  Aumenta de peso un promedio de 4 a 7onzas (113 a 198g) por semana despus de los 4 das de vida.  Aumenta de peso, diariamente, de manera uniforme a partir de los 5 das de vida, sin registrar prdida de peso despus de las 2semanas de vida. Despus de alimentarse, es posible que el beb regurgite una pequea cantidad de leche. Esto es normal. Frecuencia y duracin de la lactancia El amamantamiento frecuente la ayudar a producir ms leche y puede prevenir dolores en los pezones y las mamas extremadamente llenas (congestin mamaria). Alimente al beb cuando muestre signos de hambre o si siente la necesidad de reducir la congestin de las mamas. Esto se denomina "lactancia a demanda". Las seales de que el beb tiene hambre incluyen las siguientes:  Aumento del estado de alerta, actividad o inquietud.  Mueve la cabeza de un lado a otro.  Abre la boca cuando se le toca la mejilla o la comisura de la boca (reflejo de bsqueda).  Aumenta las vocalizaciones, tales como sonidos de succin, se relame los labios, emite arrullos, suspiros o chirridos.  Mueve la mano hacia la boca y se chupa  los dedos o las manos.  Est molesto o llora. Evite el uso del chupete en las primeras 4 a 6 semanas despus del nacimiento del beb. Despus de este perodo, podr usar un chupete. Las investigaciones demostraron que el uso del chupete durante el primer ao de vida del beb disminuye el riesgo de tener el sndrome de muerte sbita del lactante (SMSL). Permita que el nio se alimente en cada mama todo lo que desee. Cuando el beb se desprende o se queda dormido mientras se est alimentando de la primera mama, ofrzcale la segunda. Debido a que, con frecuencia, los recin nacidos estn somnolientos las primeras semanas de vida, es posible que deba despertar al beb para alimentarlo. Los horarios de lactancia varan de un beb a otro. Sin embargo, las siguientes reglas pueden servir como gua para ayudarla a garantizar que el beb se alimenta adecuadamente:  Se puede amamantar a los recin nacidos (bebs de 4 semanas o menos de vida) cada 1 a 3 horas.  No deben transcurrir ms de 3 horas durante el da o 5 horas durante la noche sin que se amamante a los recin nacidos.  Debe amamantar al beb un mnimo de 8 veces en un perodo de 24 horas. Extraccin de leche materna La extraccin y el almacenamiento de la leche materna le permiten asegurarse de que el beb se alimente exclusivamente de su leche materna, aun en momentos en los que no puede amamantar. Esto tiene especial importancia si debe regresar al trabajo en el perodo en que an est amamantando o si no puede estar presente en los momentos en que el beb debe alimentarse. Su asesor en lactancia puede ayudarla a encontrar un mtodo de extraccin que funcione mejor para usted y orientarla sobre cunto tiempo es seguro almacenar leche materna.      Cmo cuidar las mamas durante la lactancia Los pezones pueden secarse, agrietarse y doler durante la lactancia. Las siguientes recomendaciones pueden ayudarla a mantener las mamas humectadas y  sanas:  Evite usar jabn en los pezones.  Use un sostn de soporte diseado especialmente para la lactancia materna. Evite usar sostenes con aro o sostenes muy ajustados (sostenes deportivos).  Seque al aire sus pezones durante 3 a 4minutos despus de amamantar al beb.  Utilice solo apsitos de algodn en   el sostn para absorber las prdidas de leche. La prdida de un poco de leche materna entre las tomas es normal.  Utilice lanolina sobre los pezones luego de amamantar. La lanolina ayuda a mantener la humedad normal de la piel. La lanolina pura no es perjudicial (no es txica) para el beb. Adems, puede extraer manualmente algunas gotas de leche materna y masajear suavemente esa leche sobre los pezones para que la leche se seque al aire. Durante las primeras semanas despus del nacimiento, algunas mujeres experimentan congestin mamaria. La congestin mamaria puede hacer que sienta las mamas pesadas, calientes y sensibles al tacto. El pico de la congestin mamaria ocurre en el plazo de los 3 a 5 das despus del parto. Las siguientes recomendaciones pueden ayudarla a aliviar la congestin mamaria:  Vace por completo las mamas al amamantar o extraer leche. Puede aplicar calor hmedo en las mamas (en la ducha o con toallas hmedas para manos) antes de amamantar o extraer leche. Esto aumenta la circulacin y ayuda a que la leche fluya. Si el beb no vaca por completo las mamas cuando lo amamanta, extraiga la leche restante despus de que haya finalizado.  Aplique compresas de hielo sobre las mamas inmediatamente despus de amamantar o extraer leche, a menos que le resulte demasiado incmodo. Haga lo siguiente: ? Ponga el hielo en una bolsa plstica. ? Coloque una toalla entre la piel y la bolsa de hielo. ? Coloque el hielo durante 20minutos, 2 o 3veces por da.  Asegrese de que el beb est prendido y se encuentre en la posicin correcta mientras lo alimenta. Si la congestin mamaria  persiste luego de 48 horas o despus de seguir estas recomendaciones, comunquese con su mdico o un asesor en lactancia. Recomendaciones de salud general durante la lactancia  Consuma 3 comidas y 3 colaciones saludables todos los das. Las madres bien alimentadas que amamantan necesitan entre 450 y 500 caloras adicionales por da. Puede cumplir con este requisito al aumentar la cantidad de una dieta equilibrada que realice.  Beba suficiente agua para mantener la orina clara o de color amarillo plido.  Descanse con frecuencia, reljese y siga tomando sus vitaminas prenatales para prevenir la fatiga, el estrs y los niveles bajos de vitaminas y minerales en el cuerpo (deficiencias de nutrientes).  No consuma ningn producto que contenga nicotina o tabaco, como cigarrillos y cigarrillos electrnicos. El beb puede verse afectado por las sustancias qumicas de los cigarrillos que pasan a la leche materna y por la exposicin al humo ambiental del tabaco. Si necesita ayuda para dejar de fumar, consulte al mdico.  Evite el consumo de alcohol.  No consuma drogas ilegales o marihuana.  Antes de usar cualquier medicamento, hable con el mdico. Estos incluyen medicamentos recetados y de venta libre, como tambin vitaminas y suplementos a base de hierbas. Algunos medicamentos, que pueden ser perjudiciales para el beb, pueden pasar a travs de la leche materna.  Puede quedar embarazada durante la lactancia. Si se desea un mtodo anticonceptivo, consulte al mdico sobre cules son las opciones seguras durante la lactancia. Dnde encontrar ms informacin: Liga internacional [place]: www.llli.org. Comunquese con un mdico si:  Siente que quiere dejar de amamantar o se siente frustrada con la lactancia.  Sus pezones estn agrietados o sangran.  Sus mamas estn irritadas, sensibles o calientes.  Tiene los siguientes sntomas: ? Dolor en las mamas o en los pezones. ? Un rea hinchada en cualquiera  de las mamas. ? Fiebre o escalofros. ? Nuseas o vmitos. ?   Drenaje de otro lquido distinto de la leche materna desde los pezones.  Sus mamas no se llenan antes de amamantar al beb para el quinto da despus del parto.  Se siente triste y deprimida.  El beb: ? Est demasiado somnoliento como para comer bien. ? Tiene problemas para dormir. ? Tiene ms de 1 semana de vida y moja menos de 6 paales en un periodo de 24 horas. ? No ha aumentado de peso a los 5 das de vida.  El beb defeca menos de 3 veces en 24 horas.  La piel del beb o las partes blancas de los ojos se vuelven amarillentas. Solicite ayuda de inmediato si:  El beb est muy cansado (letargo) y no se quiere despertar para comer.  Le sube la fiebre sin causa. Resumen  La lactancia materna ofrece muchos beneficios de salud para bebs y madres.  Intente amamantar a su beb cuando muestre signos tempranos de hambre.  Haga cosquillas o empuje suavemente los labios del beb con el dedo o el pezn para lograr que el beb abra la boca. Acerque el beb a la mama. Asegrese de que la mayor parte de la arola se encuentre dentro de la boca del beb. Ofrzcale una mama y haga eructar al beb antes de pasar a la otra.  Hable con su mdico o asesor en lactancia si tiene dudas o problemas con la lactancia. Esta informacin no tiene como fin reemplazar el consejo del mdico. Asegrese de hacerle al mdico cualquier pregunta que tenga. Document Revised: 12/31/2017 Document Reviewed: 01/26/2017 Elsevier Patient Education  2021 Elsevier Inc.  

## 2020-11-15 LAB — GC/CHLAMYDIA PROBE AMP (~~LOC~~) NOT AT ARMC
Chlamydia: NEGATIVE
Comment: NEGATIVE
Comment: NORMAL
Neisseria Gonorrhea: NEGATIVE

## 2020-11-19 ENCOUNTER — Encounter: Payer: Self-pay | Admitting: Family Medicine

## 2020-11-19 DIAGNOSIS — O99820 Streptococcus B carrier state complicating pregnancy: Secondary | ICD-10-CM | POA: Insufficient documentation

## 2020-11-19 LAB — CULTURE, BETA STREP (GROUP B ONLY): Strep Gp B Culture: POSITIVE — AB

## 2020-11-20 ENCOUNTER — Encounter: Payer: Self-pay | Admitting: *Deleted

## 2020-11-20 ENCOUNTER — Other Ambulatory Visit: Payer: Self-pay

## 2020-11-20 ENCOUNTER — Ambulatory Visit: Payer: Self-pay | Admitting: *Deleted

## 2020-11-20 ENCOUNTER — Ambulatory Visit: Payer: Self-pay | Attending: Obstetrics and Gynecology

## 2020-11-20 DIAGNOSIS — O0990 Supervision of high risk pregnancy, unspecified, unspecified trimester: Secondary | ICD-10-CM

## 2020-11-20 DIAGNOSIS — O368390 Maternal care for abnormalities of the fetal heart rate or rhythm, unspecified trimester, not applicable or unspecified: Secondary | ICD-10-CM

## 2020-11-20 DIAGNOSIS — O24410 Gestational diabetes mellitus in pregnancy, diet controlled: Secondary | ICD-10-CM

## 2020-11-20 DIAGNOSIS — Z3A36 36 weeks gestation of pregnancy: Secondary | ICD-10-CM

## 2020-11-21 ENCOUNTER — Other Ambulatory Visit: Payer: Self-pay

## 2020-11-21 ENCOUNTER — Ambulatory Visit (INDEPENDENT_AMBULATORY_CARE_PROVIDER_SITE_OTHER): Payer: Self-pay | Admitting: Family Medicine

## 2020-11-21 VITALS — BP 114/83 | HR 80 | Wt 148.0 lb

## 2020-11-21 DIAGNOSIS — O24410 Gestational diabetes mellitus in pregnancy, diet controlled: Secondary | ICD-10-CM

## 2020-11-21 DIAGNOSIS — Z789 Other specified health status: Secondary | ICD-10-CM

## 2020-11-21 DIAGNOSIS — O0990 Supervision of high risk pregnancy, unspecified, unspecified trimester: Secondary | ICD-10-CM

## 2020-11-21 DIAGNOSIS — O99820 Streptococcus B carrier state complicating pregnancy: Secondary | ICD-10-CM

## 2020-11-21 NOTE — Progress Notes (Signed)
   Subjective:  Samantha Conner is a 35 y.o. G2P0010 at [redacted]w[redacted]d being seen today for ongoing prenatal care.  She is currently monitored for the following issues for this high-risk pregnancy and has Supervision of high risk pregnancy, antepartum; Gestational diabetes mellitus (GDM), antepartum; Language barrier; [redacted] weeks gestation of pregnancy; and GBS (group B Streptococcus carrier), +RV culture, currently pregnant on their problem list.  Patient reports no complaints.  Contractions: Not present. Vag. Bleeding: None.  Movement: Present. Denies leaking of fluid.   The following portions of the patient's history were reviewed and updated as appropriate: allergies, current medications, past family history, past medical history, past social history, past surgical history and problem list. Problem list updated.  Objective:   Vitals:   11/21/20 1542  BP: 114/83  Pulse: 80  Weight: 148 lb (67.1 kg)    Fetal Status: Fetal Heart Rate (bpm): 152 Fundal Height: 36 cm Movement: Present     General:  Alert, oriented and cooperative. Patient is in no acute distress.  Skin: Skin is warm and dry. No rash noted.   Cardiovascular: Normal heart rate noted  Respiratory: Normal respiratory effort, no problems with respiration noted  Abdomen: Soft, gravid, appropriate for gestational age. Pain/Pressure: Absent     Pelvic: Vag. Bleeding: None     Cervical exam deferred        Extremities: Normal range of motion.  Edema: None  Mental Status: Normal mood and affect. Normal behavior. Normal judgment and thought content.   Urinalysis:      Assessment and Plan:  Pregnancy: G2P0010 at [redacted]w[redacted]d  1. Diet controlled gestational diabetes mellitus (GDM), antepartum Sugars are 100% at goal today, checking regularly Last growth yesterday w EFW 86%, 3336g, normal AFI Per MFM will plan for delivery around 39 weeks Given refill for test strips  2. GBS (group B Streptococcus carrier), +RV culture, currently  pregnant PPX in labor  3. Language barrier Spanish  4. Supervision of high risk pregnancy, antepartum BP and FHR normal  Preterm labor symptoms and general obstetric precautions including but not limited to vaginal bleeding, contractions, leaking of fluid and fetal movement were reviewed in detail with the patient. Please refer to After Visit Summary for other counseling recommendations.  Return in 1 week (on 11/28/2020) [facility] ob [facility].   Clarnce Flock, MD

## 2020-11-21 NOTE — Patient Instructions (Addendum)
AREA PEDIATRIC/FAMILY PRACTICE PHYSICIANS  [facility] [address] [place], [place] [address] Phone - [phone]   Fax - [phone]  JACK AMOS [address] [place], [place]  [address] Phone - [phone]   Fax - [phone]  [facility] [address] [place], [place]  [address] Phone - [phone]   Fax - [phone]  [facility] [address] [place], [place]  [address] Phone - [phone]   Fax - [phone]  [facility] [address] [place], [place]  [address] Phone - [phone]   Fax - [phone]  [facility] [address], [address] [place], [place]  [address] Phone - [phone]   Fax - [phone]  [facility] [address] [place], [place]  [address] Phone - [phone]   Fax - [phone]  [facility] [address] [place], [place]  [address] Phone - [phone]   Fax - [phone]  [facility] [address] [place], [place]  [address] Phone - [phone]   Fax - [phone] [facility] [address] [place], [place]  [address] Phone - [phone]   Fax - [phone]  [facility] [address] [place], [place]  [address] Phone - [phone]   Fax - [phone]  [facility] [address], [address] [place], [place]  [address] Phone - [phone]   Fax - [phone]  [facility] [address] [place], [place]  [address] Phone - [phone]   Fax - [phone]  [facility] [address], [address] [place], [place]  [address] Phone - [phone]  [facility] [address] [place], [place]  [address] Phone - [phone]   Fax - [phone]  [facility] [address], [address] [place], [place]  [address] Phone - [phone]   Fax - [phone]  [facility] [address] [place], [place]  [address] Phone - [phone]   Fax - [phone]  [facility] [address] [place], [place]  [address] Phone - [phone]   Fax - [phone]   [facility] [address], [address] [place], [place]  [address] Phone - [phone]   Fax - [phone]  [facility] [address], [address] [place], [place]  [address] Phone - [phone]   Fax - [phone]  DAVID RUBIN [address] [place], [place]  [address] Phone - [phone]   Fax - [phone]  [facility] [address], [address] [place], [place]  [address] Phone - [phone]   Fax - [phone]  [facility] [address] [place], [place]  [address] Phone - [phone]   Fax - [phone] [facility] [place] [address] [place], [place]  [address] Phone - [phone]   Fax - [phone]  [facility] [address] [place], [place]  [address] Phone - [phone]   Fax - [phone]  [facility] [place] [address] [place], [place]  [address] Phone [phone]   Fax - [phone]     [email or web address].nih.gov/health/topics/labor-delivery/Pages/default.aspx">  Tercer trimestre de Psychiatristembarazo Third Trimester of Pregnancy  El tercer trimestre de embarazo va desde la semana 28 hasta la semana 40. Esto corresponde a los meses 7  a 54. El tercer trimestre es un perodo en el que el beb en gestacin (feto) crece rpidamente. Hacia el final del noveno mes, el feto mide alrededor de 20pulgadas (45cm) de largo y pesa entre 6 y 21 libras (2.7 y 4.5kg). Cambios en el cuerpo durante el tercer trimestre Durante el tercer trimestre, su cuerpo contina experimentando numerosos cambios. Los cambios varan y  generalmente vuelven a la normalidad despus del nacimiento del beb. Cambios fsicos  Seguir American Family Insurance. Es de esperar que aumente entre 25 y 35libras (51 y 16kg) Optometrist final del embarazo si inicia el Media planner con un peso normal. Si tiene bajo [place], es de esperar que aumente entre 28 y 94 libras (13 y18 kg), y si tiene sobrepeso, es de esperar que aumente entre 62 y 25 libras (7 y 32kg).  Podrn aparecer las primeras Apache Corporation caderas, el abdomen y las [place].  Las Lincoln National Corporation seguirn creciendo y Tourist information centre manager. Un lquido amarillo Public affairs consultant) puede salir de sus pechos. Esta es la primera leche que usted produce para su beb.  Tal vez haya cambios en el cabello. Esto cambios pueden incluir su engrosamiento, crecimiento rpido y Harley-Davidson textura. A algunas personas tambin se les cae el cabello durante o despus del [place], o tienen el cabello seco o fino.  El ombligo puede salir hacia afuera.  Puede observar que se le Micron Technology, el rostro o los tobillos. Cambios en la salud  Es posible que tenga acidez estomacal.  Puede sufrir estreimiento.  Puede desarrollar hemorroides.  Puede desarrollar venas hinchadas y abultadas (venas varicosas) en las piernas.  Puede presentar ms dolor en la pelvis, la espalda o los muslos. Esto se debe al Southern Company de peso y al aumento de las hormonas que relajan las articulaciones.  Puede presentar un aumento del hormigueo o entumecimiento en las manos, brazos y piernas. La piel de su abdomen tambin puede sentirse entumecida.  Puede sentir que le falta el aire debido a que se expande el tero. Otros cambios  Puede tener necesidad de Garment/textile technologist con ms frecuencia porque el feto baja hacia la pelvis y ejerce presin sobre la vejiga.  Puede tener ms problemas para dormir. Esto puede deberse al tamao de su abdomen, una mayor necesidad de orinar y un aumento en el metabolismo de su cuerpo.  Puede notar que el feto "baja" o lo siente  ms bajo, en el abdomen (aligeramiento).  Puede tener un aumento de la secrecin vaginal.  Puede notar que tiene dolor alrededor del hueso plvico a medida que el tero se distiende. Siga estas instrucciones en su casa: [place] instrucciones del mdico en relacin con el uso de medicamentos. Durante el embarazo, hay medicamentos que pueden tomarse y 45 que no. No tome ningn medicamento a menos que lo haya autorizado el mdico.  Tome vitaminas prenatales que contengan por lo menos 123456 (mcg) de cido flico. Comida y bebida  Lleve una dieta saludable que incluya frutas y verduras frescas, cereales integrales, buenas fuentes de protenas como carnes Min[place], huevos o tofu, y productos lcteos descremados.  Evite la carne cruda y el [place], la [place] y el queso sin Radio producer. Estos portan grmenes que pueden provocar dao tanto a usted como al beb.  Tome 4 o 5 comidas pequeas en lugar de 3 comidas abundantes al da.  Es posible que tenga que tomar estas medidas para prevenir o tratar el estreimiento: ? Electronics engineer suficiente lquido como para Theatre manager la orina de color  amarillo plido. ? Consumir alimentos ricos en fibra, como frijoles, cereales integrales, y frutas y verduras frescas. ? Limitar el consumo de alimentos ricos en grasa y azcares procesados, como los alimentos fritos o dulces. Actividad  Haga ejercicio solamente como se lo haya indicado el mdico. La mayora de las personas pueden continuar su actividad fsica habitual durante el [place]. Intente realizar como mnimo 59minutos de actividad fsica por lo menos 5das a la semana. Deje de hacer ejercicio si experimenta contracciones en el tero.  Deje de hacer ejercicio si le aparecen dolor o clicos en la parte baja del vientre o de la espalda.  Evite levantar pesos EMCOR.  No haga ejercicio si hace mucho calor o humedad, o si se encuentra a una altitud elevada.  Si lo desea, puede seguir  teniendo Office Depot, salvo que el mdico le indique lo contrario. Alivio del dolor y del [place] pausas frecuentes y descanse con las piernas levantadas (elevadas) si tiene calambres en las piernas o dolor en la parte baja de la espalda.  Dese baos de asiento con agua tibia para Best boy o las molestias causadas por las hemorroides. Use una crema para las hemorroides si el mdico la autoriza.  Use un sujetador que le brinde buen soporte para prevenir las molestias causadas por la sensibilidad en las [place].  Si tiene venas varicosas: ? Use medias de compresin como se lo haya indicado el mdico. ? Eleve los pies durante 71minutos, 3 o 4veces por da. ? Limite el consumo de sal en su dieta. Seguridad  Hable con su mdico antes de viajar distancias largas.  No se d baos de inmersin en agua caliente, baos turcos ni saunas.  Use el cinturn de seguridad en todo momento mientras conduce o va en auto.  Hable con el mdico si es vctima de Terex Corporation o fsico. Preparacin para el nacimiento Para prepararse para la llegada de su beb:  Tome clases prenatales para entender, Psychologist, prison and probation services, y hacer preguntas sobre el [place] de parto y [place].  Visite el hospital y recorra el rea de maternidad.  Compre un asiento de seguridad QUALCOMM, y asegrese de saber cmo instalarlo en su automvil.  Prepare la habitacin o el lugar donde dormir el beb. Asegrese de quitar todas las almohadas y [place] de peluche de la cuna del beb para evitar la asfixia. Indicaciones generales  Evite el contacto con las bandejas sanitarias de los gatos y la tierra que estos animales usan. Estos alimentos contienen bacterias que pueden causar defectos congnitos en el beb. Si tiene Research scientist (physical sciences), pdale a alguien que limpie la caja de arena por usted.  No se haga lavados vaginales ni use tampones. No use toallas higinicas perfumadas.  No consuma ningn producto que  contenga nicotina o tabaco, como cigarrillos, cigarrillos electrnicos y tabaco de Higher education careers adviser. Si necesita ayuda para dejar de consumir estos productos, consulte al mdico.  No use ningn remedio a base de hierbas, drogas ilegales o medicamentos que no le hayan sido recetados. Las sustancias qumicas de estos productos pueden daar al beb.  No beba alcohol.  Le realizarn exmenes prenatales ms frecuentes durante el tercer trimestre. Durante una visita prenatal de rutina, el mdico le har un examen fsico, Teacher, early years/pre pruebas y Electrical engineer con usted de su salud general. Cumpla con todas las visitas de seguimiento. Esto es importante. Dnde buscar ms informacin  American Pregnancy Association (Asociacin Estadounidense del Embarazo): americanpregnancy.[place] and Gynecologists (Colegio Anadarko Petroleum Corporation  de Obstetras y [place]): [email or web address]?  Office on Home Depot ([place]): [email or web address] Comunquese con un mdico si tiene:  Systems analyst.  Clicos leves en la pelvis, presin en la pelvis o dolor persistente en la zona abdominal o la parte baja de la espalda.  Vmitos o diarrea.  Secrecin vaginal con mal olor u orina con mal olor.  Dolor al [place].  Un dolor de cabeza que no desaparece despus de Teacher, adult education.  Cambios en la visin o ve manchas delante de los ojos. Solicite ayuda de inmediato si:  Rompe la bolsa.  Tiene contracciones regulares separadas por menos de 22minutos.  Tiene sangrado o pequeas prdidas vaginales.  Siente un dolor abdominal intenso.  Tiene dificultad para respirar.  Siente dolor en el pecho.  Sufre episodios de Kimberly-Clark.  No ha sentido a su beb moverse durante el perodo de Agilent Technologies indic el mdico.  Tiene dolor, hinchazn o enrojecimiento nuevos en un brazo o una pierna o se produce un aumento de alguno de estos sntomas. Resumen  El tercer  trimestre del Media planner comprende desde la [id number] hasta la semana 36 (desde el mes7 hasta el mes9).  Puede tener ms problemas para dormir. Esto puede deberse al tamao de su abdomen, una mayor necesidad de orinar y un aumento en el metabolismo de su cuerpo.  Le realizarn exmenes prenatales ms frecuentes durante el tercer trimestre. Cumpla con todas las visitas de seguimiento. Esto es importante. Esta informacin no tiene Marine scientist el consejo del mdico. Asegrese de hacerle al mdico cualquier pregunta que tenga. Document Revised: 04/13/2020 Document Reviewed: 04/13/2020 Elsevier Patient Education  2021 [place].   Infeccin por estreptococo del grupo B durante el embarazo Group B Streptococcus Infection During Pregnancy El estreptococo del grupo B (EGB) es un tipo de bacteria que se encuentra a Jones Apparel Group sanas. Generalmente se encuentra en el recto, la vagina y los intestinos. En personas saludables y en mujeres no embarazadas, rara vez la bacteria provoca una enfermedad o complicaciones graves. Sin embargo, las mujeres [place] de EGB es positiva durante el embarazo pueden transmitirle la bacteria al beb en el parto. Esto puede provocar una infeccin grave en el beb despus del nacimiento. Las mujeres con EGB tambin pueden tener infecciones durante el Media planner o poco despus del [place]. Las infecciones incluyen infecciones de las vas urinarias (IU) o infecciones del tero. Los EGB tambin McDonald's Corporation riesgo de complicaciones durante el [place], como parto o trabajo de parto prematuros, aborto espontneo o muerte fetal. Se recomienda que todas las embarazadas se hagan pruebas de rutina para determinar la presencia de EGB. Cules son las causas? Esta afeccin es causada por la bacteria denominada Streptococcus agalactiae. Qu incrementa el riesgo? Puede tener un mayor riesgo de contraer una infeccin por EGB durante el embarazo si ya le ocurri en un  embarazo previo. Cules son los signos o sntomas? En la Hovnanian Enterprises, la infeccin por EGB no causa sntomas en las [place]. Si hay sntomas, estos pueden incluir:  Inicio del trabajo de parto antes de la semana 92 de gestacin.  Una infeccin urinaria (IU) o en la vejiga. Esto puede causar fiebre, miccin frecuente o dolor y ardor al Garment/textile technologist.  Fiebre durante el [place] de [place]. Tambin puede haber latido cardaco rpido en la madre o en el beb. Los sntomas poco frecuentes pero graves de una posible infeccin por EGB en las mujeres incluyen:  Infeccin en la sangre (septicemia).  Esto puede provocar fiebre, escalofros o confusin.  Infeccin pulmonar (neumona). Esto puede provocar fiebre, escalofros, tos, respiracin rpida, dolor torcico o dificultad para respirar.  Infeccin en los huesos, las articulaciones, la piel o los tejidos blandos. Cmo se diagnostica? Le pueden realizar exmenes de deteccin de EGB entre la semana 50 y la semana 25 de gestacin. Si tiene sntomas de trabajo de Environmental education officer, Educational psychologist los exmenes de deteccin antes. Esta afeccin se diagnostica a travs de los [place] de anlisis de laboratorio de:  Un hisopado del lquido de la vagina y del recto.  Truddie Coco de [place]. Cmo se trata? Esta afeccin se trata con un antibitico. Le pueden administrar antibiticos:  Al comenzar el trabajo de parto o apenas se rompa la bolsa de aguas. El uso de los medicamentos continuar hasta despus del [place]. Si tiene un parto por cesrea no necesita antibiticos, salvo que se haya roto la bolsa de [place].  Para el beb, si necesita tratamiento. El mdico controlar al beb para decidir si necesita antibiticos a fin de prevenir una infeccin grave.   Siga estas instrucciones en su casa:  Tome los medicamentos de venta libre y los recetados solamente como se lo haya indicado el mdico.  Tome su antibitico como se lo haya indicado el  mdico. No deje de tomar el antibitico aunque comience a sentirse mejor.  Concurra a todas las visitas previas al parto (prenatales) y las visitas de control como se lo haya indicado el mdico. Esto es importante. Comunquese con un mdico si:  Siente dolor o ardor al Garment/textile technologist.  Tiene que orinar con ms frecuencia de lo habitual.  Tiene fiebre o escalofros.  Tiene una secrecin vaginal con mal olor. Solicite ayuda de inmediato si:  Rompe la bolsa.  Comienza el trabajo de [place].  Siente un dolor intenso en el abdomen.  Tiene dificultad para respirar.  Siente dolor en el pecho. Estos sntomas pueden representar un problema grave que constituye Engineer, maintenance (IT). No espere a ver si los sntomas desaparecen. Solicite atencin mdica de inmediato. Comunquese con el servicio de emergencias de su localidad (911 en los Estados [place]idos). No conduzca por sus propios medios Principal Financial. Resumen  El EGB es un tipo de bacteria que se encuentra con frecuencia en personas sanas.  Durante el embarazo, la colonizacin con EGB puede causar complicaciones graves para usted o el beb.  El mdico la examinar entre la semana 59 y 78 de embarazo para Teacher, adult education si tiene Social worker de EGB.  Si esto sucede Solicitor, el mdico le recomendar antibiticos a travs de una va intravenosa durante el [place] de [place].  Despus del parto, se evaluar al beb para detectar complicaciones relacionadas con una posible infeccin por EGB, lo cual puede requerir antibiticos para prevenir una infeccin grave. Esta informacin no tiene Marine scientist el consejo del mdico. Asegrese de hacerle al mdico cualquier pregunta que tenga. Document Revised: 02/22/2020 Document Reviewed: 06/22/2019 Elsevier Patient Education  2021 [place] anticonceptivo Contraception Choices La anticoncepcin, o los mtodos anticonceptivos, hace referencia a los mtodos o dispositivos  que evitan el [place]. Mtodos hormonales Implante anticonceptivo Un implante anticonceptivo consiste en un tubo delgado de plstico que contiene una hormona que evita el [place]. Es diferente de un dispositivo intrauterino (DIU). Un mdico lo inserta en la parte superior del brazo. Los implantes pueden ser eficaces durante un mximo de 3 aos. Inyecciones de progestina sola Las inyecciones de progestina sola contienen  progestina, una forma sinttica de la hormona progesterona. Un mdico las administra cada 3 meses. Pldoras anticonceptivas Las pldoras anticonceptivas son pastillas que contienen hormonas que evitan el [place]. Deben tomarse una vez al da, preferentemente a [place]. Se necesita una receta para utilizar este mtodo anticonceptivo. Parche anticonceptivo El parche anticonceptivo contiene hormonas que evitan el [place]. Se coloca en la piel, debe cambiarse una vez a la semana durante tres semanas y debe retirarse en la cuarta semana. Se necesita una receta para utilizar este mtodo anticonceptivo. Anillo vaginal Un anillo vaginal contiene hormonas que evitan el embarazo. Se coloca en la vagina durante tres semanas y se retira en la cuarta semana. Luego se repite el proceso con un anillo nuevo. Se necesita una receta para utilizar este mtodo anticonceptivo. Anticonceptivo de emergencia Los anticonceptivos de emergencia son mtodos para evitar un embarazo despus de Best boy sexo sin proteccin. Vienen en forma de pldora y pueden tomarse hasta 5 das despus de [place]. Funcionan mejor cuando se toman lo ms pronto posible luego de Merrill Lynch. La mayora de los anticonceptivos de emergencia estn disponibles sin receta mdica. Este mtodo no debe utilizarse como el nico mtodo anticonceptivo.   Mtodos de barrera Condn masculino Un condn masculino es una vaina delgada que se coloca sobre el pene durante el sexo. Los condones evitan que el esperma ingrese en el  cuerpo de la [place]. Pueden utilizarse con un una sustancia que mata a los espermatozoides (espermicida) para aumentar la efectividad. Deben desecharse despus de un uso. Condn femenino Un condn femenino es una vaina blanda y holgada que se coloca en la vagina antes de [address]. El condn evita que el esperma ingrese en el cuerpo de la [place]. Deben desecharse despus de un uso. Diafragma Un diafragma es una barrera blanda con forma de cpula. Se inserta en la vagina antes del sexo, junto con un espermicida. El diafragma bloquea el ingreso de esperma en el tero, y el espermicida mata a los espermatozoides. El Designer, television/film set en la vagina durante 6 a 8 horas despus de Best boy sexo y debe retirarse en el plazo de las 24 horas. Un diafragma es recetado y colocado por un mdico. Debe reemplazarse cada 1 a 2 aos, despus de dar a luz, de aumentar ms de 15lb (6.8kg) y de [place] plvica. Capuchn cervical Un capuchn cervical es una copa redonda y blanda de ltex o plstico que se coloca en el cuello uterino. Se inserta en la vagina antes del sexo, junto con un espermicida. Bloquea el ingreso del esperma en el tero. El capuchn Medical illustrator durante 6 a 8 horas despus de Best boy sexo y debe retirarse en el plazo de las 48 horas. Un capuchn cervical debe ser recetado y colocado por un mdico. Debe reemplazarse cada 2aos. Esponja Una esponja es una pieza blanda y circular de espuma de poliuretano que contiene espermicida. La esponja ayuda a bloquear el ingreso de esperma en el tero, y el espermicida mata a los espermatozoides. Marylyn Ishihara, debe humedecerla e insertarla en la vagina. Debe insertarse antes de Merrill Lynch, debe permanecer dentro al menos durante 6 horas despus de tener sexo y debe retirarse y Aeronautical engineer en el plazo de las 30 horas. Espermicidas Los espermicidas son sustancias qumicas que matan o bloquean al esperma y no lo dejan ingresar al cuello uterino y  al tero. Vienen en forma de crema, gel, supositorio, espuma o comprimido. Un espermicida debe insertarse en la  vagina con un aplicador al menos 10 o 15 minutos antes de tener sexo para dar tiempo a que surta [place]. El proceso debe repetirse cada vez que tenga sexo. Los espermicidas no requieren Furniture conservator/restorer.   Anticonceptivos intrauterinos Dispositivo intrauterino (DIU) Un DIU es un dispositivo en forma de T que se coloca en el tero. Existen dos tipos:  DIU hormonal.Este tipo contiene progestina, una forma sinttica de la hormona progesterona. Este tipo puede permanecer colocado durante 3 a 5 aos.  DIU de cobre.Este tipo est recubierto con un alambre de cobre. Puede permanecer colocado durante 10 aos. Mtodos anticonceptivos permanentes Ligadura de trompas en la mujer En este mtodo, se sellan, atan u obstruyen las trompas de Falopio durante una ciruga para Product/process development scientist que el vulo descienda [place]. Esterilizacin histeroscpica En este mtodo, se coloca un implante pequeo y flexible dentro de cada trompa de Falopio. Los implantes hacen que se forme un tejido cicatricial en las trompas de Falopio y que las obstruya para que el espermatozoide no pueda llegar al vulo. El procedimiento demora alrededor de 3 meses para que sea [place]. Debe utilizarse otro mtodo anticonceptivo durante esos 3 meses. Esterilizacin masculina Este es un procedimiento que consiste en atar los conductos que transportan el esperma (vasectoma). Luego del procedimiento, el hombre Equities trader lquido (semen). Debe utilizarse otro mtodo anticonceptivo durante 3 meses despus del procedimiento. Mtodos de planificacin natural Planificacin familiar natural En este mtodo, la pareja no tiene SPX Corporation la mujer podra quedar [place]. Mtodo calendario En este mtodo, la mujer realiza un seguimiento de la duracin de cada ciclo menstrual, identifica los MeadWestvaco que se puede producir un  Media planner y no tiene sexo durante esos das. Mtodo de la ovulacin En este mtodo, la pareja evita tener sexo durante la ovulacin. Mtodo sintotrmico Este mtodo implica no tener sexo durante la ovulacin. Normalmente, la mujer comprueba la ovulacin al observar cambios en su temperatura y en la consistencia del moco cervical. Mtodo posovulacin En este mtodo, la pareja espera a que finalice la ovulacin para Adult nurse. Dnde buscar ms informacin  Centers for Disease Control and Prevention (Centros para el Control y Publishing copy de Arboriculturist): http://www.wolf.info/ Resumen  La anticoncepcin, o los mtodos anticonceptivos, hace referencia a los mtodos o dispositivos que evitan el [place].  Los mtodos anticonceptivos hormonales incluyen implantes, inyecciones, pastillas, parches, anillos vaginales y anticonceptivos de Freight forwarder.  Los mtodos anticonceptivos de barrera pueden incluir condones masculinos, condones femeninos, diafragmas, capuchones cervicales, esponjas y espermicidas.  Sears Holdings Corporation tipos de DIU (dispositivo intrauterino). Un DIU puede colocarse en el tero de una mujer para evitar el embarazo durante 3 a 5 aos.  La esterilizacin permanente puede realizarse mediante un procedimiento tanto en los hombres como en las mujeres. Los NIKE de Marine scientist natural implican no tener SPX Corporation la mujer podra quedar [place]. Esta informacin no tiene Marine scientist el consejo del mdico. Asegrese de hacerle al mdico cualquier pregunta que tenga. Document Revised: 05/08/2020 Document Reviewed: 05/08/2020 Elsevier Patient Education  2021 [place] materna Breastfeeding  Decidir amamantar es una de las mejores elecciones que puede hacer por usted y su beb. Un cambio en las hormonas durante el embarazo hace que las mamas produzcan leche materna en las glndulas productoras de [place]. Las hormonas impiden que la leche materna  sea liberada antes del nacimiento del beb. Adems, impulsan el flujo de leche luego del nacimiento. Una vez que  ha comenzado a Economist, Freight forwarder beb, as como la succin o Social research officer, government, pueden estimular la liberacin de [place] de las glndulas productoras de [place]. Los beneficios de Colgate-Palmolive investigaciones demuestran que la lactancia materna ofrece muchos beneficios de salud para bebs y [place]. Adems, ofrece una forma gratuita y conveniente de Research scientist (life sciences) al beb. Para el beb  La primera leche (calostro) ayuda a Garment/textile technologist funcionamiento del aparato digestivo del beb.  Las clulas especiales de la leche (anticuerpos) ayudan a Radio broadcast assistant las infecciones en el beb.  Los bebs que se alimentan con leche materna tambin tienen menos probabilidades de tener asma, alergias, obesidad o diabetes de tipo 2. Adems, tienen menor riesgo de sufrir el sndrome de muerte sbita del lactante (SMSL).  [place] son mejores para Engineer, water las necesidades del beb en comparacin con la Humana Inc.  La leche materna mejora el desarrollo cerebral del beb. Para usted  La lactancia materna favorece el desarrollo de un vnculo muy especial entre la madre y el beb.  Es conveniente. La leche materna es econmica y siempre est disponible a la Tree surgeon.  La lactancia materna ayuda a quemar caloras. Wyatt Mage a perder el peso ganado durante el [place].  Hace que el tero vuelva al tamao que tena antes del embarazo ms rpido. Adems, disminuye el sangrado (loquios) despus del parto.  La lactancia materna contribuye a reducir Catering manager de tener diabetes de tipo 2, osteoporosis, artritis reumatoide, enfermedades cardiovasculares y cncer de mama, ovario, tero y endometrio en el futuro. Informacin bsica sobre la lactancia Comienzo de la lactancia  Encuentre un lugar cmodo para sentarse o Acupuncturist, con un buen respaldo para el cuello y la  espalda.  Coloque una almohada o una manta enrollada debajo del beb para acomodarlo a la altura de la mama (si est sentada). Las almohadas para Economist se han diseado especialmente a fin de servir de apoyo para los brazos y el beb Kellogg.  Asegrese de que la barriga del beb (abdomen) est frente a la suya.  Masajee suavemente la mama. Con las yemas de los dedos, Apple Computer bordes exteriores de la mama hacia adentro, en direccin al pezn. Esto estimula el flujo de [place]. Si la The Timken Company, es posible que deba Clinical biochemist con este movimiento durante la Transport planner.  Sostenga la mama con 4 dedos por debajo y Counselling psychologist por arriba del pezn (forme la letra "C" con la mano). Asegrese de que los dedos se encuentren lejos del pezn y de la boca del beb.  Empuje suavemente los labios del beb con el pezn o con el dedo.  Cuando la boca del beb se abra lo suficiente, acrquelo rpidamente a la mama e introduzca todo el pezn y la arola, tanto como sea posible, dentro de la boca del beb. La arola es la zona de color que rodea al pezn. ? Debe haber ms arola visible por arriba del labio superior del beb que por debajo del labio inferior. ? Los labios del beb deben estar abiertos y extendidos hacia afuera (evertidos) para asegurar que el beb se prenda de forma adecuada y cmoda. ? La lengua del beb debe estar entre la enca inferior y Scientist, research (medical).  Asegrese de que la boca del beb est en la posicin correcta alrededor del pezn (prendido). Los labios del beb deben crear un sello sobre la mama y estar doblados hacia afuera (invertidos).  Es comn que el beb succione durante 2 a  3 minutos para que comience el flujo de SLM Corporation. Cmo debe prenderse Es muy importante que le ensee al beb cmo prenderse adecuadamente a la mama. Si el beb no se prende adecuadamente, puede causar DTE Energy Company, reducir la produccin de [place] materna y Field seismologist que el beb tenga un  escaso aumento de [place]. Adems, si el beb no se prende adecuadamente al pezn, puede tragar aire durante la alimentacin. Esto puede causarle molestias al beb. Hacer eructar al beb al Eliezer Lofts de mama puede ayudarlo a liberar el aire. Sin embargo, ensearle al beb cmo prenderse a la mama adecuadamente es la mejor manera de evitar que se sienta molesto por tragar Administrator, sports se alimenta. Signos de que el beb se ha prendido adecuadamente al pezn  Tironea o succiona de modo silencioso, sin Education administrator. Los labios del beb deben estar extendidos hacia afuera (evertidos).  Se escucha que traga cada 3 o 4 succiones una vez que la Northeast Utilities ha comenzado a Airline pilot (despus de que se produzca el reflejo de eyeccin de la [place]).  Hay movimientos musculares por arriba y por delante de sus odos al Mining engineer. Signos de que el beb no se ha prendido Product manager al pezn  Hace ruidos de succin o de chasquido mientras se Haematologist.  Siente dolor en los pezones. Si cree que el beb no se prendi correctamente, deslice el dedo en la comisura de la boca y Micron Technology las encas del beb para interrumpir la succin. Intente volver a comenzar a Economist. Signos de Transport planner materna exitosa Signos del beb  El beb disminuir gradualmente el nmero de succiones o dejar de succionar por completo.  El beb se quedar dormido.  El cuerpo del beb se relajar.  El beb retendr Ardelia Mems pequea cantidad de ALLTEL Corporation boca.  El beb se desprender solo del [place]. Signos que presenta usted  Las mamas han aumentado la firmeza, el peso y el tamao 1 a 3 horas despus de Economist.  Estn ms blandas inmediatamente despus de amamantar.  Se producen un aumento del volumen de [place] y un cambio en su consistencia y color [place].  Los pezones no duelen, no estn agrietados ni sangran. Signos de que su beb recibe la cantidad de leche suficiente  Mojar por lo menos 1 o  2paales durante las primeras 24horas despus del nacimiento.  Mojar por lo menos 5 o 6paales cada 24horas durante la primera semana despus del nacimiento. La orina debe ser clara o de color amarillo plido a los 5das de vida.  Mojar entre 6 y 8paales cada 24horas a medida que el beb sigue creciendo y desarrollndose.  Defeca por lo menos 3 veces en 24 horas a los 5 das de vida. Las heces deben ser blandas y Careers adviser.  Defeca por lo menos 3 veces en 24 horas a los [address] de vida. Las heces deben ser grumosas y Careers adviser.  No registra una prdida de peso mayor al 10% del peso al nacer durante los primeros [place].  Aumenta de peso un promedio de 4 a 7onzas (113 a 198g) por semana despus de los [place].  Aumenta de [place], [place], de [place]rel uniforme a Proofreader de los 5 das de vida, sin Museum/gallery curator prdida de peso despus de las 2semanas de vida. Despus de alimentarse, es posible que el beb regurgite una pequea cantidad de [place]. Esto es normal. Frecuencia y duracin de la lactancia El amamantamiento frecuente la  ayudar a producir ms [place] y puede prevenir dolores en los pezones y las mamas extremadamente llenas (congestin [place]). Alimente al beb cuando muestre signos de hambre o si siente la necesidad de reducir la congestin de las [place]. Esto se denomina "lactancia a demanda". Las seales de que el beb tiene hambre incluyen las siguientes:  Aumento del [place] de [place], [place] o inquietud.  Mueve la cabeza de un lado a otro.  Abre la boca cuando se le toca la mejilla o la comisura de la boca (reflejo de bsqueda).  [place], tales como sonidos de succin, se relame los labios, emite arrullos, suspiros o chirridos.  Mueve la Longs [employer] Stores boca y se chupa los dedos o las manos.  Est molesto o llora. Evite el uso del chupete en las primeras 4 a 6 semanas despus del nacimiento del beb. Despus de este perodo, podr  usar un chupete. Las investigaciones demostraron que el uso del chupete durante Software engineer ao de vida del beb disminuye el riesgo de tener el sndrome de muerte sbita del lactante (SMSL). Permita que el nio se alimente en cada mama todo lo que desee. Cuando el beb se desprende o se queda dormido mientras se est alimentando de la primera mama, ofrzcale la segunda. Debido a que, con frecuencia, los recin nacidos estn somnolientos las primeras semanas de vida, es posible que deba despertar al beb para alimentarlo. Los horarios de Writer de un beb a otro. Sin embargo, las siguientes reglas pueden servir como gua para ayudarla a Engineer, materials que el beb se alimenta adecuadamente:  Se puede amamantar a los recin nacidos (bebs de 4 semanas o menos de vida) cada 1 a 3 horas.  No deben transcurrir ms de 3 horas durante el da o 5 horas durante la noche sin que se amamante a los recin nacidos.  Debe amamantar al beb un mnimo de 8 veces en un perodo de 24 horas. Extraccin de Black & Decker extraccin y Recruitment consultant de la leche materna le permiten asegurarse de que el beb se alimente exclusivamente de su leche materna, aun en momentos en los que no puede Economist. Esto tiene especial importancia si debe regresar al [place] en el perodo en que an est amamantando o si no puede estar presente en los momentos en que el beb debe alimentarse. Su asesor en lactancia puede ayudarla a Pension scheme manager un mtodo de extraccin que funcione mejor para usted y Aeronautical engineer cunto tiempo es [place].      Cmo cuidar las mamas durante la lactancia Los pezones pueden secarse, Medical illustrator y doler durante la Transport planner. Las siguientes recomendaciones pueden ayudarla a Theatre manager las [employer] y sanas:  Art therapist usar jabn en los pezones.  Use un sostn de soporte diseado especialmente para la lactancia materna. Evite usar sostenes con aro o sostenes muy ajustados  (sostenes deportivos).  Seque al aire sus pezones durante 3 a 19minutos despus de amamantar al beb.  Utilice solo apsitos de Chiropodist sostn para Tax adviser las prdidas de [place]. La prdida de un poco de Owens Corning tomas es normal.  Utilice lanolina sobre los pezones luego de Economist. La lanolina ayuda a mantener la humedad normal de la piel. La lanolina pura no es perjudicial (no es txica) para el beb. Adems, puede extraer Cisco algunas gotas de [place] materna y Community education officer suavemente esa Express Scripts pezones para que la Covenant Life se seque al aire. [place] despus del  nacimiento, algunas mujeres experimentan congestin mamaria. La congestin The Pepsi puede hacer que sienta las mamas pesadas, calientes y sensibles al tacto. El pico de la congestin mamaria ocurre en el plazo de los 3 a 5 das despus del [place]. Las siguientes recomendaciones pueden ayudarla a Public house manager la congestin mamaria:  Vace por completo las mamas al [place]. Puede aplicar calor hmedo en las mamas (en la ducha o con toallas hmedas para manos) antes de Economist o extraer Northeast Utilities. Esto aumenta la circulacin y [place] a que la [place]. Si el beb no vaca por completo las mamas cuando lo amamanta, extraiga la [place] restante despus de que haya finalizado.  Aplique compresas de hielo Erie Insurance Group inmediatamente despus de Economist o extraer [place], a menos que le resulte demasiado incmodo. Haga lo siguiente: ? Ponga el hielo en una bolsa plstica. ? Coloque una Genuine Parts piel y la bolsa de hielo. ? Coloque el hielo durante 25minutos, 2 o 3veces por da.  Asegrese de que el beb est prendido y se encuentre en la posicin correcta mientras lo alimenta. Si la congestin mamaria persiste luego de 48 horas o despus de seguir estas recomendaciones, comunquese con su mdico o un Lobbyist. Recomendaciones de salud general durante la  lactancia  Consuma 3 comidas y 3 colaciones [place]. Las Toll Brothers bien alimentadas que amamantan necesitan entre 450 y [place] por Training and development officer. Puede cumplir con este requisito al aumentar la cantidad de una dieta equilibrada que realice.  Beba suficiente agua para mantener la orina clara o de color amarillo plido.  Descanse con frecuencia, reljese y siga tomando sus vitaminas prenatales para prevenir la fatiga, el estrs y los niveles bajos de vitaminas y Boston Scientific en el cuerpo (deficiencias de nutrientes).  No consuma ningn producto que contenga nicotina o tabaco, como cigarrillos y Psychologist, sport and exercise. El beb puede verse afectado por las sustancias qumicas de los cigarrillos que pasan a la Story materna y por la exposicin al humo ambiental del tabaco. Si necesita ayuda para dejar de fumar, consulte al mdico.  Evite el consumo de alcohol.  No consuma drogas ilegales o marihuana.  Antes de Engineer, manufacturing systems, hable con el mdico. Estos incluyen medicamentos recetados y de [place], como tambin vitaminas y suplementos a base de hierbas. Algunos medicamentos, que pueden ser perjudiciales para el beb, pueden pasar a travs de la SLM Corporation.  Puede quedar embarazada durante la lactancia. Si se desea un mtodo anticonceptivo, consulte al mdico sobre cules son [place]. Dnde encontrar ms informacin: Liga internacional [place]eche: [email or web address]. Comunquese con un mdico si:  Siente que quiere dejar de Economist o se siente frustrada con la lactancia.  Sus pezones estn agrietados o Control and instrumentation engineer.  Sus mamas estn irritadas, sensibles o calientes.  Tiene los siguientes sntomas: ? Dolor en las mamas o en los pezones. ? Un rea hinchada en cualquiera de las mamas. ? Cristy Hilts o escalofros. ? Nuseas o vmitos. ? Drenaje de otro lquido distinto de la Northeast Utilities materna desde los pezones.  Sus mamas no se llenan  antes de Economist al beb para el quinto da despus del [place].  Se siente triste y deprimida.  El beb: ? Est demasiado somnoliento como para comer bien. ? Tiene problemas para dormir. ? Tiene ms de 1 semana de vida y Albertson's de 6 paales en un periodo de 24 horas. ? No ha KeyCorp a los 5 das de  vida.  El beb defeca menos de 3 veces en 24 horas.  La piel del beb o las partes blancas de los ojos se vuelven amarillentas. Solicite ayuda de inmediato si:  El beb est muy cansado Engineer, manufacturing) y no se quiere despertar para comer.  Le sube la fiebre sin causa. Resumen  La lactancia materna ofrece muchos beneficios de salud para bebs y [place].  Intente amamantar a su beb cuando muestre signos tempranos de hambre.  Haga cosquillas o empuje suavemente los labios del beb con el dedo o el pezn para lograr que el beb abra la boca. Acerque el beb a la mama. Asegrese de que la mayor parte de la arola se encuentre dentro de la boca del beb. Ofrzcale una mama y haga eructar al beb antes de pasar a la otra.  Hable con su mdico o asesor en lactancia si tiene dudas o problemas con la lactancia. Esta informacin no tiene Marine scientist el consejo del mdico. Asegrese de hacerle al mdico cualquier pregunta que tenga. Document Revised: 12/31/2017 Document Reviewed: 01/26/2017 Elsevier Patient Education  [place].

## 2020-11-21 NOTE — Progress Notes (Signed)
AMN healthcare interpreter Angie # [phone]

## 2020-11-28 ENCOUNTER — Other Ambulatory Visit: Payer: Self-pay

## 2020-11-28 ENCOUNTER — Other Ambulatory Visit: Payer: Self-pay | Admitting: Advanced Practice Midwife

## 2020-11-28 ENCOUNTER — Telehealth (INDEPENDENT_AMBULATORY_CARE_PROVIDER_SITE_OTHER): Payer: Self-pay | Admitting: Family Medicine

## 2020-11-28 VITALS — BP 119/85 | HR 84

## 2020-11-28 DIAGNOSIS — O24410 Gestational diabetes mellitus in pregnancy, diet controlled: Secondary | ICD-10-CM

## 2020-11-28 DIAGNOSIS — O99820 Streptococcus B carrier state complicating pregnancy: Secondary | ICD-10-CM

## 2020-11-28 DIAGNOSIS — Z789 Other specified health status: Secondary | ICD-10-CM

## 2020-11-28 DIAGNOSIS — O0990 Supervision of high risk pregnancy, unspecified, unspecified trimester: Secondary | ICD-10-CM

## 2020-11-28 DIAGNOSIS — Z3A37 37 weeks gestation of pregnancy: Secondary | ICD-10-CM

## 2020-11-28 DIAGNOSIS — Z603 Acculturation difficulty: Secondary | ICD-10-CM

## 2020-11-28 DIAGNOSIS — O0993 Supervision of high risk pregnancy, unspecified, third trimester: Secondary | ICD-10-CM

## 2020-11-28 NOTE — Progress Notes (Signed)
I connected with Samantha Conner 11/28/20 at  1:15 PM EST by: telephone due to inability to connect by video and verified that I am speaking with the correct person using two identifiers.  Patient is located at home and provider is located at [facility].     The purpose of this virtual visit is to provide medical care while limiting exposure to the novel coronavirus. I discussed the limitations, risks, security and privacy concerns of performing an evaluation and management service by telephone and the availability of in person appointments. I also discussed with the patient that there may be a patient responsible charge related to this service. By engaging in this virtual visit, you consent to the provision of healthcare.  Additionally, you authorize for your insurance to be billed for the services provided during this visit.  The patient expressed understanding and agreed to proceed.  The following staff members participated in the virtual visit:  Clarnce Flock, MD/MPH    PRENATAL VISIT NOTE  Subjective:  Samantha Conner is a 35 y.o. G2P0010 at [redacted]w[redacted]d  for phone visit for ongoing prenatal care.  She is currently monitored for the following issues for this high-risk pregnancy and has Supervision of high risk pregnancy, antepartum; Gestational diabetes mellitus (GDM), antepartum; Language barrier; [redacted] weeks gestation of pregnancy; and GBS (group B Streptococcus carrier), +RV culture, currently pregnant on their problem list.  Patient reports no complaints.  Contractions: Not present. Vag. Bleeding: None.  Movement: Present. Denies leaking of fluid.   The following portions of the patient's history were reviewed and updated as appropriate: allergies, current medications, past family history, past medical history, past social history, past surgical history and problem list.   Objective:   Vitals:   11/28/20 1318  BP: 119/85  Pulse: 84   Self-Obtained  Fetal Status:     Movement:  Present     Assessment and Plan:  Pregnancy: G2P0010 at [redacted]w[redacted]d 1. Supervision of high risk pregnancy, antepartum BP normal Good fetal movement Discussed MFM recommendation for IOL around 39 weeks, she is amenable to this OK with midnight IOL, form faxed and orders placed  2. Language barrier Spanish  3. GBS (group B Streptococcus carrier), +RV culture, currently pregnant   4. Diet controlled gestational diabetes mellitus (GDM), antepartum Sugars are at goal today with diet only, reports only single fasting value of 96 and no elevated post prandial Has followed regularly w MFM for growth scans, most recently 11/20/2020 EFW 3336g at 86% with normal AFI  Term labor symptoms and general obstetric precautions including but not limited to vaginal bleeding, contractions, leaking of fluid and fetal movement were reviewed in detail with the patient.  Return in 1 week (on 12/05/2020).  Future Appointments  Date Time Provider [place]  12/05/2020  8:35 AM Clarnce Flock, MD [facility]  12/12/2020  8:35 AM Clarnce Flock, MD [facility]     Time spent on virtual visit: 12 minutes  Clarnce Flock, MD

## 2020-11-28 NOTE — Progress Notes (Signed)
I connected with  Samantha Conner on 11/28/20 at  1:15 PM EST by telephone and verified that I am speaking with the correct person using two identifiers.   I discussed the limitations, risks, security and privacy concerns of performing an evaluation and management service by telephone and the availability of in person appointments. I also discussed with the patient that there may be a patient responsible charge related to this service. The patient expressed understanding and agreed to proceed.  Georgia Lopes, RN 11/28/2020  1:13 PM  [place] # [phone]

## 2020-11-28 NOTE — Progress Notes (Signed)
IOL form filled out, signed by Dr Dione Plover and faxed to [facility].

## 2020-11-28 NOTE — Patient Instructions (Signed)
[email or web address]">  Tercer trimestre de Media planner Third Trimester of Pregnancy  El tercer trimestre de embarazo va desde la semana 28 hasta la semana 40. Esto corresponde a los meses 7 a 9. El tercer trimestre es un perodo en el que el beb en gestacin (feto) crece rpidamente. Hacia el final del noveno mes, el feto mide alrededor de 20pulgadas (45cm) de largo y pesa entre 6 y 54 libras (2.7 y 4.5kg). Cambios en el cuerpo durante el tercer trimestre Durante el tercer trimestre, su cuerpo contina experimentando numerosos cambios. Los cambios varan y generalmente vuelven a la normalidad despus del nacimiento del beb. Cambios fsicos  Seguir [employer]. Es de esperar que aumente entre 25 y 35libras (20 y 16kg) Optometrist final del embarazo si inicia el Media planner con un peso normal. Si tiene bajo [place], es de esperar que aumente entre 28 y 4 libras (13 y18 kg), y si tiene sobrepeso, es de esperar que aumente entre 93 y 25 libras (7 y 28kg).  Podrn aparecer las primeras Apache Corporation caderas, el abdomen y las [place].  Las [employer] seguirn creciendo y Tourist information centre manager. Un lquido [place] Public affairs consultant) puede salir de sus pechos. Esta es la primera leche que usted produce para su beb.  Tal vez haya cambios en el cabello. Esto cambios pueden incluir su engrosamiento, crecimiento rpido y [employer]ley[employer]son textura. A algunas personas tambin se les cae el cabello durante o despus del [place], o tienen el cabello seco o fino.  El ombligo puede salir hacia afuera.  Puede observar que se le Micron Technology, el rostro o los tobillos. Cambios en la salud  Es posible que tenga acidez estomacal.  Puede sufrir estreimiento.  Puede desarrollar hemorroides.  Puede desarrollar venas hinchadas y abultadas (venas varicosas) en las piernas.  Puede presentar ms dolor en la pelvis, la espalda o los muslos. Esto se debe al [employer] Company de peso  y al aumento de las hormonas que relajan las articulaciones.  Puede presentar un aumento del hormigueo o entumecimiento en las manos, brazos y piernas. La piel de su abdomen tambin puede sentirse entumecida.  Puede sentir que le falta el aire debido a que se expande el tero. Otros cambios  Puede tener necesidad de Garment/textile technologist con ms frecuencia porque el feto baja hacia la pelvis y ejerce presin sobre la vejiga.  Puede tener ms problemas para dormir. Esto puede deberse al tamao de su abdomen, una mayor necesidad de orinar y un aumento en el metabolismo de su cuerpo.  Puede notar que el feto "baja" o lo siente ms bajo, en el abdomen (aligeramiento).  Puede tener un aumento de la secrecin vaginal.  Puede notar que tiene dolor alrededor del hueso plvico a medida que el tero se distiende. Siga estas instrucciones en su casa: [place] instrucciones del mdico en relacin con el uso de medicamentos. Durante el embarazo, hay medicamentos que pueden tomarse y 81 que no. No tome ningn medicamento a menos que lo haya autorizado el mdico.  Tome vitaminas prenatales que contengan por lo menos [id number] (mcg) de cido flico. Comida y bebida  Lleve una dieta saludable que incluya frutas y verduras frescas, cereales integrales, buenas fuentes de protenas como carnes [place], huevos o tofu, y productos lcteos descremados.  Evite la carne cruda y el [place], la [place] y el queso sin Radio producer. Estos portan grmenes que pueden provocar dao tanto a usted como al beb.  Tome 4 o 5 comidas pequeas en lugar de  3 comidas abundantes al da.  Es posible que tenga que tomar estas medidas para prevenir o tratar el estreimiento: ? Electronics engineer suficiente lquido como para Theatre manager la orina de color amarillo plido. ? Consumir alimentos ricos en fibra, como frijoles, cereales integrales, y frutas y verduras frescas. ? Limitar el consumo de alimentos ricos en grasa y azcares procesados,  como los alimentos fritos o dulces. Actividad  Haga ejercicio solamente como se lo haya indicado el mdico. La mayora de las personas pueden continuar su actividad fsica habitual durante el [place]. Intente realizar como mnimo 79minutos de actividad fsica por lo menos 5das a la semana. Deje de hacer ejercicio si experimenta contracciones en el tero.  Deje de hacer ejercicio si le aparecen dolor o clicos en la parte baja del vientre o de la espalda.  Evite levantar pesos [employer].  No haga ejercicio si hace mucho calor o humedad, o si se encuentra a una altitud elevada.  Si lo desea, puede seguir teniendo [employer], salvo que el mdico le indique lo contrario. Alivio del dolor y del [address] pausas frecuentes y descanse con las piernas levantadas (elevadas) si tiene calambres en las piernas o dolor en la parte baja de la espalda.  Dese baos de asiento con agua tibia para Best boy o las molestias causadas por las hemorroides. Use una crema para las hemorroides si el mdico la autoriza.  Use un sujetador que le brinde buen soporte para prevenir las molestias causadas por la sensibilidad en las [place].  Si tiene venas varicosas: ? Use medias de compresin como se lo haya indicado el mdico. ? Eleve los pies durante 75minutos, 3 o 4veces por da. ? Limite el consumo de sal en su dieta. Seguridad  Hable con su mdico antes de viajar distancias largas.  No se d baos de inmersin en agua caliente, baos turcos ni saunas.  Use el cinturn de seguridad en todo momento mientras conduce o va en auto.  Hable con el mdico si es vctima de [employer] Corporation o fsico. Preparacin para el nacimiento Para prepararse para la llegada de su beb:  Tome clases prenatales para entender, Psychologist, prison and probation services, y hacer preguntas sobre el [place] de parto y [place].  Visite el hospital y recorra el rea de maternidad.  Compre un asiento de seguridad [employer], y  asegrese de saber cmo instalarlo en su automvil.  Prepare la habitacin o el lugar donde dormir el beb. Asegrese de quitar todas las almohadas y [place] de peluche de la cuna del beb para evitar la asfixia. Indicaciones generales  Evite el contacto con las bandejas sanitarias de los gatos y la tierra que estos animales usan. Estos alimentos contienen bacterias que pueden causar defectos congnitos en el beb. Si tiene Research scientist (physical sciences), pdale a alguien que limpie la caja de arena por usted.  No se haga lavados vaginales ni use tampones. No use toallas higinicas perfumadas.  No consuma ningn producto que contenga nicotina o tabaco, como cigarrillos, cigarrillos electrnicos y tabaco de Higher education careers adviser. Si necesita ayuda para dejar de consumir estos productos, consulte al mdico.  No use ningn remedio a base de hierbas, drogas ilegales o medicamentos que no le hayan sido recetados. Las sustancias qumicas de estos productos pueden daar al beb.  No beba alcohol.  Le realizarn exmenes prenatales ms frecuentes durante el tercer trimestre. Durante una visita prenatal de rutina, el mdico le har un examen fsico, Teacher, early years/pre pruebas y Electrical engineer con usted de su salud general. Cumpla  con todas [place]. Esto es importante. Dnde buscar ms informacin  American Pregnancy Association (Asociacin Estadounidense del Embarazo): americanpregnancy.org  [employer] Corporation of Obstetricians and Gynecologists (Colegio Estadounidense de Obstetras y [place]): [email or web address]?  Office on Home [employer]ot ([place]): [email or web address] Comunquese con un mdico si tiene:  Systems analyst.  Clicos leves en la pelvis, presin en la pelvis o dolor persistente en la zona abdominal o la parte baja de la espalda.  Vmitos o diarrea.  Secrecin vaginal con mal olor u orina con mal olor.  Dolor al [place].  Un dolor de cabeza que no desaparece despus de  Teacher, adult education.  Cambios en la visin o ve manchas delante de los ojos. Solicite ayuda de inmediato si:  Rompe la bolsa.  Tiene contracciones regulares separadas por menos de 65minutos.  Tiene sangrado o pequeas prdidas vaginales.  Siente un dolor abdominal intenso.  Tiene dificultad para respirar.  Siente dolor en el pecho.  Sufre episodios de [employer].  No ha sentido a su beb moverse durante el perodo de [employer] Technologies indic el mdico.  Tiene dolor, hinchazn o enrojecimiento nuevos en un brazo o una pierna o se produce un aumento de alguno de estos sntomas. Resumen  El tercer trimestre del Media planner comprende desde la [id number] hasta la semana 101 (desde el mes7 hasta el mes9).  Puede tener ms problemas para dormir. Esto puede deberse al tamao de su abdomen, una mayor necesidad de orinar y un aumento en el metabolismo de su cuerpo.  Le realizarn exmenes prenatales ms frecuentes durante el tercer trimestre. Cumpla con todas las visitas de seguimiento. Esto es importante. Esta informacin no tiene Marine scientist el consejo del mdico. Asegrese de hacerle al mdico cualquier pregunta que tenga. Document Revised: 04/13/2020 Document Reviewed: 04/13/2020 Elsevier Patient Education  2021 [place].   Infeccin por estreptococo del grupo B durante el embarazo Group B Streptococcus Infection During Pregnancy El estreptococo del grupo B (EGB) es un tipo de bacteria que se encuentra a [employer] sanas. Generalmente se encuentra en el recto, la vagina y los intestinos. En personas saludables y en mujeres no embarazadas, rara vez la bacteria provoca una enfermedad o complicaciones graves. Sin embargo, las mujeres [place] de EGB es positiva durante el embarazo pueden transmitirle la bacteria al beb en el parto. Esto puede provocar una infeccin grave en el beb despus del nacimiento. Las mujeres con EGB tambin pueden tener infecciones durante el  Media planner o poco despus del [place]. Las infecciones incluyen infecciones de las vas urinarias (IU) o infecciones del tero. Los EGB tambin McDonald's Corporation riesgo de complicaciones durante el [place], como parto o trabajo de parto prematuros, aborto espontneo o muerte fetal. Se recomienda que todas las embarazadas se hagan pruebas de rutina para determinar la presencia de EGB. Cules son las causas? Esta afeccin es causada por la bacteria denominada Streptococcus agalactiae. Qu incrementa el riesgo? Puede tener un mayor riesgo de contraer una infeccin por EGB durante el embarazo si ya le ocurri en un embarazo previo. Cules son los signos o sntomas? En la [employer] [place], la infeccin por EGB no causa sntomas en las [place]. Si hay sntomas, estos pueden incluir:  Inicio del trabajo de parto antes de la semana 77 de gestacin.  Una infeccin urinaria (IU) o en la vejiga. Esto puede causar fiebre, miccin frecuente o dolor y ardor al Garment/textile technologist.  Fiebre durante el [place]. [place]  cardaco rpido en la madre o en el beb. Los sntomas poco frecuentes pero graves de una posible infeccin por EGB en las mujeres incluyen:  Infeccin en la sangre (septicemia). Esto puede provocar fiebre, escalofros o confusin.  Infeccin pulmonar (neumona). Esto puede provocar fiebre, escalofros, tos, respiracin rpida, dolor torcico o dificultad para respirar.  Infeccin en los huesos, las articulaciones, la piel o los tejidos blandos. Cmo se diagnostica? Le pueden realizar exmenes de deteccin de EGB entre la semana 59 y la semana 40 de gestacin. Si tiene sntomas de trabajo de Environmental education officer, Educational psychologist los exmenes de deteccin antes. Esta afeccin se diagnostica a travs de los [place] de anlisis de laboratorio de:  Un hisopado del lquido de la vagina y del recto.  [place] de [place]. Cmo se trata? Esta afeccin se trata con un  antibitico. Le pueden administrar antibiticos:  Al comenzar el trabajo de parto o apenas se rompa la bolsa de aguas. El uso de los medicamentos continuar hasta despus del [place]. Si tiene un parto por cesrea no necesita antibiticos, salvo que se haya roto la bolsa de [place].  Para el beb, si necesita tratamiento. El mdico controlar al beb para decidir si necesita antibiticos a fin de prevenir una infeccin grave.   Siga estas instrucciones en su casa:  Tome los medicamentos de venta libre y los recetados solamente como se lo haya indicado el mdico.  Tome su antibitico como se lo haya indicado el mdico. No deje de tomar el antibitico aunque comience a sentirse mejor.  Concurra a todas las visitas previas al parto (prenatales) y las visitas de control como se lo haya indicado el mdico. Esto es importante. Comunquese con un mdico si:  Siente dolor o ardor al Garment/textile technologist.  Tiene que orinar con ms frecuencia de lo habitual.  Tiene fiebre o escalofros.  Tiene una secrecin vaginal con mal olor. Solicite ayuda de inmediato si:  Rompe la bolsa.  Comienza el trabajo de [place].  Siente un dolor intenso en el abdomen.  Tiene dificultad para respirar.  Siente dolor en el pecho. Estos sntomas pueden representar un problema grave que constituye Engineer, maintenance (IT). No espere a ver si los sntomas desaparecen. Solicite atencin mdica de inmediato. Comunquese con el servicio de emergencias de su localidad (911 en los [place]). No conduzca por sus propios medios Principal Financial. Resumen  El EGB es un tipo de bacteria que se encuentra con frecuencia en personas sanas.  Durante el embarazo, la colonizacin con EGB puede causar complicaciones graves para usted o el beb.  El mdico la examinar entre la semana 21 y 64 de embarazo para Teacher, adult education si tiene Social worker de EGB.  Si esto sucede Solicitor, el mdico le recomendar antibiticos a travs de una va  intravenosa durante el [place] de [place].  Despus del parto, se evaluar al beb para detectar complicaciones relacionadas con una posible infeccin por EGB, lo cual puede requerir antibiticos para prevenir una infeccin grave. Esta informacin no tiene Marine scientist el consejo del mdico. Asegrese de hacerle al mdico cualquier pregunta que tenga. Document Revised: 02/22/2020 Document Reviewed: 06/22/2019 Elsevier Patient Education  2021 [employer].

## 2020-11-29 ENCOUNTER — Telehealth (HOSPITAL_COMMUNITY): Payer: Self-pay | Admitting: *Deleted

## 2020-11-29 ENCOUNTER — Encounter (HOSPITAL_COMMUNITY): Payer: Self-pay | Admitting: *Deleted

## 2020-11-29 NOTE — Telephone Encounter (Signed)
Interpreter number [phone] Preadmission screen

## 2020-12-05 ENCOUNTER — Inpatient Hospital Stay (HOSPITAL_COMMUNITY)
Admission: AD | Admit: 2020-12-05 | Discharge: 2020-12-08 | DRG: 786 | Disposition: A | Discharge status: Home / Self Care | Payer: Medicaid Other | Attending: Family Medicine | Admitting: Family Medicine

## 2020-12-05 ENCOUNTER — Other Ambulatory Visit (HOSPITAL_COMMUNITY): Payer: Self-pay | Attending: Family Medicine

## 2020-12-05 ENCOUNTER — Other Ambulatory Visit: Payer: Self-pay

## 2020-12-05 ENCOUNTER — Encounter (HOSPITAL_COMMUNITY): Payer: Self-pay | Admitting: Family Medicine

## 2020-12-05 ENCOUNTER — Telehealth (INDEPENDENT_AMBULATORY_CARE_PROVIDER_SITE_OTHER): Payer: Self-pay | Admitting: Family Medicine

## 2020-12-05 VITALS — BP 117/76

## 2020-12-05 DIAGNOSIS — O99824 Streptococcus B carrier state complicating childbirth: Secondary | ICD-10-CM | POA: Diagnosis present

## 2020-12-05 DIAGNOSIS — Z3A38 38 weeks gestation of pregnancy: Secondary | ICD-10-CM

## 2020-12-05 DIAGNOSIS — O9912 Other diseases of the blood and blood-forming organs and certain disorders involving the immune mechanism complicating childbirth: Secondary | ICD-10-CM | POA: Diagnosis present

## 2020-12-05 DIAGNOSIS — K831 Obstruction of bile duct: Secondary | ICD-10-CM | POA: Diagnosis present

## 2020-12-05 DIAGNOSIS — O2662 Liver and biliary tract disorders in childbirth: Secondary | ICD-10-CM | POA: Diagnosis present

## 2020-12-05 DIAGNOSIS — L299 Pruritus, unspecified: Secondary | ICD-10-CM | POA: Diagnosis present

## 2020-12-05 DIAGNOSIS — O24410 Gestational diabetes mellitus in pregnancy, diet controlled: Secondary | ICD-10-CM

## 2020-12-05 DIAGNOSIS — Z20822 Contact with and (suspected) exposure to covid-19: Secondary | ICD-10-CM | POA: Diagnosis present

## 2020-12-05 DIAGNOSIS — O26893 Other specified pregnancy related conditions, third trimester: Secondary | ICD-10-CM | POA: Diagnosis present

## 2020-12-05 DIAGNOSIS — D509 Iron deficiency anemia, unspecified: Secondary | ICD-10-CM | POA: Diagnosis present

## 2020-12-05 DIAGNOSIS — O99820 Streptococcus B carrier state complicating pregnancy: Secondary | ICD-10-CM

## 2020-12-05 DIAGNOSIS — O99119 Other diseases of the blood and blood-forming organs and certain disorders involving the immune mechanism complicating pregnancy, unspecified trimester: Secondary | ICD-10-CM

## 2020-12-05 DIAGNOSIS — O9902 Anemia complicating childbirth: Secondary | ICD-10-CM | POA: Diagnosis present

## 2020-12-05 DIAGNOSIS — Z789 Other specified health status: Secondary | ICD-10-CM | POA: Diagnosis present

## 2020-12-05 DIAGNOSIS — O24420 Gestational diabetes mellitus in childbirth, diet controlled: Secondary | ICD-10-CM | POA: Diagnosis present

## 2020-12-05 DIAGNOSIS — Z603 Acculturation difficulty: Secondary | ICD-10-CM

## 2020-12-05 DIAGNOSIS — O0990 Supervision of high risk pregnancy, unspecified, unspecified trimester: Secondary | ICD-10-CM

## 2020-12-05 DIAGNOSIS — O2686 Pruritic urticarial papules and plaques of pregnancy (PUPPP): Secondary | ICD-10-CM

## 2020-12-05 DIAGNOSIS — D6959 Other secondary thrombocytopenia: Secondary | ICD-10-CM | POA: Diagnosis present

## 2020-12-05 DIAGNOSIS — O0993 Supervision of high risk pregnancy, unspecified, third trimester: Secondary | ICD-10-CM

## 2020-12-05 DIAGNOSIS — D696 Thrombocytopenia, unspecified: Secondary | ICD-10-CM

## 2020-12-05 DIAGNOSIS — O24419 Gestational diabetes mellitus in pregnancy, unspecified control: Secondary | ICD-10-CM | POA: Diagnosis present

## 2020-12-05 LAB — CBC
HCT: 40.7 % (ref 36.0–46.0)
HCT: 40.4 % (ref 36.0–46.0)
Hemoglobin: 13.8 g/dL (ref 12.0–15.0)
Hemoglobin: 13.1 g/dL (ref 12.0–15.0)
MCH: 30.2 pg (ref 26.0–34.0)
MCH: 28.9 pg (ref 26.0–34.0)
MCHC: 33.9 g/dL (ref 30.0–36.0)
MCHC: 32.4 g/dL (ref 30.0–36.0)
MCV: 89.1 fL (ref 80.0–100.0)
MCV: 89.2 fL (ref 80.0–100.0)
Platelets: 107 10*3/uL — ABNORMAL LOW (ref 150–400)
Platelets: 114 10*3/uL — ABNORMAL LOW (ref 150–400)
RBC: 4.57 MIL/uL (ref 3.87–5.11)
RBC: 4.53 MIL/uL (ref 3.87–5.11)
RDW: 15.1 % (ref 11.5–15.5)
RDW: 14.9 % (ref 11.5–15.5)
WBC: 6.3 10*3/uL (ref 4.0–10.5)
WBC: 8.8 10*3/uL (ref 4.0–10.5)
nRBC: 0 % (ref 0.0–0.2)
nRBC: 0 % (ref 0.0–0.2)

## 2020-12-05 LAB — TYPE AND SCREEN
ABO/RH(D): O POS
Antibody Screen: NEGATIVE

## 2020-12-05 LAB — COMPREHENSIVE METABOLIC PANEL
ALT: 54 U/L — ABNORMAL HIGH (ref 0–44)
ALT: 57 U/L — ABNORMAL HIGH (ref 0–44)
AST: 29 U/L (ref 15–41)
AST: 30 U/L (ref 15–41)
Albumin: 3.1 g/dL — ABNORMAL LOW (ref 3.5–5.0)
Albumin: 3 g/dL — ABNORMAL LOW (ref 3.5–5.0)
Alkaline Phosphatase: 249 U/L — ABNORMAL HIGH (ref 38–126)
Alkaline Phosphatase: 256 U/L — ABNORMAL HIGH (ref 38–126)
Anion gap: 12 (ref 5–15)
Anion gap: 14 (ref 5–15)
BUN: 8 mg/dL (ref 6–20)
BUN: 8 mg/dL (ref 6–20)
CO2: 22 mmol/L (ref 22–32)
CO2: 17 mmol/L — ABNORMAL LOW (ref 22–32)
Calcium: 9.4 mg/dL (ref 8.9–10.3)
Calcium: 9.4 mg/dL (ref 8.9–10.3)
Chloride: 103 mmol/L (ref 98–111)
Chloride: 101 mmol/L (ref 98–111)
Creatinine, Ser: 0.63 mg/dL (ref 0.44–1.00)
Creatinine, Ser: 0.56 mg/dL (ref 0.44–1.00)
GFR, Estimated: >60 mL/min (ref >60)
GFR, Estimated: >60 mL/min (ref >60)
Glucose, Bld: 84 mg/dL (ref 70–99)
Glucose, Bld: 79 mg/dL (ref 70–99)
Potassium: 3.5 mmol/L (ref 3.5–5.1)
Potassium: 3.6 mmol/L (ref 3.5–5.1)
Sodium: 137 mmol/L (ref 135–145)
Sodium: 132 mmol/L — ABNORMAL LOW (ref 135–145)
Total Bilirubin: 1.4 mg/dL — ABNORMAL HIGH (ref 0.3–1.2)
Total Bilirubin: 1.5 mg/dL — ABNORMAL HIGH (ref 0.3–1.2)
Total Protein: 7.2 g/dL (ref 6.5–8.1)
Total Protein: 6.9 g/dL (ref 6.5–8.1)

## 2020-12-05 LAB — RESP PANEL BY RT-PCR (FLU A&B, COVID) ARPGX2
Influenza A by PCR: NEGATIVE
Influenza B by PCR: NEGATIVE
SARS Coronavirus 2 by RT PCR: NEGATIVE

## 2020-12-05 LAB — PROTEIN / CREATININE RATIO, URINE
Creatinine, Urine: 124.86 mg/dL
Protein Creatinine Ratio: 0.14 mg/mg{Cre} (ref 0.00–0.15)
Total Protein, Urine: 18 mg/dL

## 2020-12-05 LAB — GLUCOSE, CAPILLARY
Glucose-Capillary: 81 mg/dL (ref 70–99)
Glucose-Capillary: 140 mg/dL — ABNORMAL HIGH (ref 70–99)
Glucose-Capillary: 84 mg/dL (ref 70–99)
Glucose-Capillary: 68 mg/dL — ABNORMAL LOW (ref 70–99)
Glucose-Capillary: 79 mg/dL (ref 70–99)

## 2020-12-05 MED ORDER — MISOPROSTOL 50MCG HALF TABLET
50.0000 ug | ORAL_TABLET | ORAL | Status: DC | PRN
  Filled 2020-12-05: qty 1

## 2020-12-05 MED ORDER — FENTANYL CITRATE (PF) 100 MCG/2ML IJ SOLN
50.0000 ug | INTRAMUSCULAR | Status: DC | PRN
  Administered 2020-12-06: 100 ug via INTRAVENOUS
  Filled 2020-12-05: qty 2

## 2020-12-05 MED ORDER — TERBUTALINE SULFATE 1 MG/ML IJ SOLN: 0.2500 mg | Freq: Once | INTRAMUSCULAR | Status: DC | PRN

## 2020-12-05 MED ORDER — ACETAMINOPHEN 325 MG PO TABS: 650.0000 mg | ORAL_TABLET | ORAL | Status: DC | PRN

## 2020-12-05 MED ORDER — SODIUM CHLORIDE 0.9 % IV SOLN
5.0000 10*6.[IU] | Freq: Once | INTRAVENOUS | Status: AC
  Administered 2020-12-05: 5 10*6.[IU] via INTRAVENOUS
  Filled 2020-12-05: qty 5

## 2020-12-05 MED ORDER — SOD CITRATE-CITRIC ACID 500-334 MG/5ML PO SOLN
30.0000 mL | ORAL | Status: DC | PRN
  Administered 2020-12-06: 30 mL via ORAL
  Filled 2020-12-05: qty 15

## 2020-12-05 MED ORDER — LACTATED RINGERS IV SOLN: INTRAVENOUS | Status: DC

## 2020-12-05 MED ORDER — PENICILLIN G POT IN DEXTROSE 60000 UNIT/ML IV SOLN
3.0000 10*6.[IU] | INTRAVENOUS | Status: DC
  Administered 2020-12-05 – 2020-12-06 (×7): 3 10*6.[IU] via INTRAVENOUS
  Filled 2020-12-05 (×7): qty 50

## 2020-12-05 MED ORDER — ONDANSETRON HCL 4 MG/2ML IJ SOLN: 4.0000 mg | Freq: Four times a day (QID) | INTRAMUSCULAR | Status: DC | PRN

## 2020-12-05 MED ORDER — LACTATED RINGERS IV SOLN: 500.0000 mL | INTRAVENOUS | Status: DC | PRN

## 2020-12-05 MED ORDER — LIDOCAINE HCL (PF) 1 % IJ SOLN: 30.0000 mL | INTRAMUSCULAR | Status: DC | PRN

## 2020-12-05 MED ORDER — OXYTOCIN-SODIUM CHLORIDE 30-0.9 UT/500ML-% IV SOLN
1.0000 m[IU]/min | INTRAVENOUS | Status: DC
  Administered 2020-12-05: 2 m[IU]/min via INTRAVENOUS

## 2020-12-05 MED ORDER — OXYTOCIN BOLUS FROM INFUSION: 333.0000 mL | Freq: Once | INTRAVENOUS | Status: DC

## 2020-12-05 MED ORDER — OXYTOCIN-SODIUM CHLORIDE 30-0.9 UT/500ML-% IV SOLN
2.5000 [IU]/h | INTRAVENOUS | Status: DC
  Filled 2020-12-05: qty 500

## 2020-12-05 NOTE — Progress Notes (Signed)
LABOR PROGRESS NOTE  Samantha Conner is a 35 y.o. G2P0010 at [redacted]w[redacted]d  admitted for IOL for possible cholestasis given diffuse pruritus without rash.  Subjective: Patient reports feeling more pressure, but states the pain is still tolerable. She denies other concerns or questions at this time.  Objective: BP 120/80   Pulse 86   Temp 98.2 F (36.8 C) (Oral)   Resp 18   Ht 4' 11.5" (1.511 m)   Wt 66.8 kg   LMP 03/09/2020   BMI 29.23 kg/m  or  Vitals:   12/05/20 1405 12/05/20 1436 12/05/20 1509 12/05/20 1558  BP: 116/83 114/80 115/78 120/80  Pulse: 91 85 87 86  Resp: 20 16 18    Temp:      TempSrc:      Weight:      Height:        Dilation: 4.5 Effacement (%): 90 Station: -2 Presentation: Vertex Exam by:: [employer] FHT: baseline rate 145, moderate varibility, occasional acel, no decels UC: irregular, every 2-5 minutes  Labs: Lab Results  Component Value Date   WBC 6.3 12/05/2020   HGB 13.8 12/05/2020   HCT 40.7 12/05/2020   MCV 89.1 12/05/2020   PLT 107 (L) 12/05/2020    Patient Active Problem List   Diagnosis Date Noted  . GDM (gestational diabetes mellitus) 12/05/2020  . GBS (group B Streptococcus carrier), +RV culture, currently pregnant 11/19/2020  . Language barrier 07/06/2020  . Gestational diabetes mellitus (GDM), antepartum 06/28/2020  . Supervision of high risk pregnancy, antepartum 06/19/2020    Assessment / Plan: 35 y.o. G2P0010 at [redacted]w[redacted]d here for IOL due to concern for cholestasis. Also with hx of A1GDM.   Labor: progressing appropriately, currently on pitocin 65mL/hr Fetal Wellbeing:  Cat 1 Pain Control:  Adequate, prn per patient request Anticipated MOD:  NSVD   [facility] PGY-1 12/05/2020, 4:54 PM

## 2020-12-05 NOTE — Patient Instructions (Signed)
[email or web address]">  Tercer trimestre de Media planner Third Trimester of Pregnancy  El tercer trimestre de embarazo va desde la semana 28 hasta la semana 40. Esto corresponde a los meses 7 a 9. El tercer trimestre es un perodo en el que el beb en gestacin (feto) crece rpidamente. Hacia el final del noveno mes, el feto mide alrededor de 20pulgadas (45cm) de largo y pesa entre 6 y 72 libras (2.7 y 4.5kg). Cambios en el cuerpo durante el tercer trimestre Durante el tercer trimestre, su cuerpo contina experimentando numerosos cambios. Los cambios varan y generalmente vuelven a la normalidad despus del nacimiento del beb. Cambios fsicos  Seguir [employer] Family Insurance. Es de esperar que aumente entre 25 y 35libras (40 y 16kg) Optometrist final del embarazo si inicia el Media planner con un peso normal. Si tiene bajo [place], es de esperar que aumente entre 28 y 12 libras (13 y18 kg), y si tiene sobrepeso, es de esperar que aumente entre 75 y 25 libras (7 y 37kg).  Podrn aparecer las primeras Apache Corporation caderas, el abdomen y las [place].  Las Lincoln National Corporation seguirn creciendo y Tourist information centre manager. Un lquido amarillo Public affairs consultant) puede salir de sus pechos. Esta es la primera leche que usted produce para su beb.  Tal vez haya cambios en el cabello. Esto cambios pueden incluir su engrosamiento, crecimiento rpido y Harley-Davidson textura. A algunas personas tambin se les cae el cabello durante o despus del [place], o tienen el cabello seco o fino.  El ombligo puede salir hacia afuera.  Puede observar que se le Micron Technology, el rostro o los tobillos. Cambios en la salud  Es posible que tenga acidez estomacal.  Puede sufrir estreimiento.  Puede desarrollar hemorroides.  Puede desarrollar venas hinchadas y abultadas (venas varicosas) en las piernas.  Puede presentar ms dolor en la pelvis, la espalda o los muslos. Esto se debe al Southern Company de peso  y al aumento de las hormonas que relajan las articulaciones.  Puede presentar un aumento del hormigueo o entumecimiento en las manos, brazos y piernas. La piel de su abdomen tambin puede sentirse entumecida.  Puede sentir que le falta el aire debido a que se expande el tero. Otros cambios  Puede tener necesidad de Garment/textile technologist con ms frecuencia porque el feto baja hacia la pelvis y ejerce presin sobre la vejiga.  Puede tener ms problemas para dormir. Esto puede deberse al tamao de su abdomen, una mayor necesidad de orinar y un aumento en el metabolismo de su cuerpo.  Puede notar que el feto "baja" o lo siente ms bajo, en el abdomen (aligeramiento).  Puede tener un aumento de la secrecin vaginal.  Puede notar que tiene dolor alrededor del hueso plvico a medida que el tero se distiende. Siga estas instrucciones en su casa: Gem instrucciones del mdico en relacin con el uso de medicamentos. Durante el embarazo, hay medicamentos que pueden tomarse y 23 que no. No tome ningn medicamento a menos que lo haya autorizado el mdico.  Tome vitaminas prenatales que contengan por lo menos [id number] (mcg) de cido flico. Comida y bebida  Lleve una dieta saludable que incluya frutas y verduras frescas, cereales integrales, buenas fuentes de protenas como carnes [place], huevos o tofu, y productos lcteos descremados.  Evite la carne cruda y el [place], la [place] y el queso sin Radio producer. Estos portan grmenes que pueden provocar dao tanto a usted como al beb.  Tome 4 o 5 comidas pequeas en lugar de  3 comidas abundantes al da.  Es posible que tenga que tomar estas medidas para prevenir o tratar el estreimiento: ? Electronics engineer suficiente lquido como para Theatre manager la orina de color amarillo plido. ? Consumir alimentos ricos en fibra, como frijoles, cereales integrales, y frutas y verduras frescas. ? Limitar el consumo de alimentos ricos en grasa y azcares procesados,  como los alimentos fritos o dulces. Actividad  Haga ejercicio solamente como se lo haya indicado el mdico. La mayora de las personas pueden continuar su actividad fsica habitual durante el [place]. Intente realizar como mnimo 57minutos de actividad fsica por lo menos 5das a la semana. Deje de hacer ejercicio si experimenta contracciones en el tero.  Deje de hacer ejercicio si le aparecen dolor o clicos en la parte baja del vientre o de la espalda.  Evite levantar pesos EMCOR.  No haga ejercicio si hace mucho calor o humedad, o si se encuentra a una altitud elevada.  Si lo desea, puede seguir teniendo [employer]ot, salvo que el mdico le indique lo contrario. Alivio del dolor y del [place] pausas frecuentes y descanse con las piernas levantadas (elevadas) si tiene calambres en las piernas o dolor en la parte baja de la espalda.  Dese baos de asiento con agua tibia para Best boy o las molestias causadas por las hemorroides. Use una crema para las hemorroides si el mdico la autoriza.  Use un sujetador que le brinde buen soporte para prevenir las molestias causadas por la sensibilidad en las [place].  Si tiene venas varicosas: ? Use medias de compresin como se lo haya indicado el mdico. ? Eleve los pies durante 67minutos, 3 o 4veces por da. ? Limite el consumo de sal en su dieta. Seguridad  Hable con su mdico antes de viajar distancias largas.  No se d baos de inmersin en agua caliente, baos turcos ni saunas.  Use el cinturn de seguridad en todo momento mientras conduce o va en auto.  Hable con el mdico si es vctima de Terex Corporation o fsico. Preparacin para el nacimiento Para prepararse para la llegada de su beb:  Tome clases prenatales para entender, Psychologist, prison and probation services, y hacer preguntas sobre el [place] de parto y [place].  Visite el hospital y recorra el rea de maternidad.  Compre un asiento de seguridad QUALCOMM, y  asegrese de saber cmo instalarlo en su automvil.  Prepare la habitacin o el lugar donde dormir el beb. Asegrese de quitar todas las almohadas y [place] de peluche de la cuna del beb para evitar la asfixia. Indicaciones generales  Evite el contacto con las bandejas sanitarias de los gatos y la tierra que estos animales usan. Estos alimentos contienen bacterias que pueden causar defectos congnitos en el beb. Si tiene Research scientist (physical sciences), pdale a alguien que limpie la caja de arena por usted.  No se haga lavados vaginales ni use tampones. No use toallas higinicas perfumadas.  No consuma ningn producto que contenga nicotina o tabaco, como cigarrillos, cigarrillos electrnicos y tabaco de Higher education careers adviser. Si necesita ayuda para dejar de consumir estos productos, consulte al mdico.  No use ningn remedio a base de hierbas, drogas ilegales o medicamentos que no le hayan sido recetados. Las sustancias qumicas de estos productos pueden daar al beb.  No beba alcohol.  Le realizarn exmenes prenatales ms frecuentes durante el tercer trimestre. Durante una visita prenatal de rutina, el mdico le har un examen fsico, Teacher, early years/pre pruebas y Electrical engineer con usted de su salud general. Cumpla  con todas [place]. Esto es importante. Dnde buscar ms informacin  American Pregnancy Association (Asociacin Estadounidense del Embarazo): americanpregnancy.org  SPX Corporation of Obstetricians and Gynecologists (Colegio Estadounidense de Obstetras y [place]): [email or web address]?  Office on Home Depot ([place]): [email or web address] Comunquese con un mdico si tiene:  Systems analyst.  Clicos leves en la pelvis, presin en la pelvis o dolor persistente en la zona abdominal o la parte baja de la espalda.  Vmitos o diarrea.  Secrecin vaginal con mal olor u orina con mal olor.  Dolor al [place].  Un dolor de cabeza que no desaparece despus de  Teacher, adult education.  Cambios en la visin o ve manchas delante de los ojos. Solicite ayuda de inmediato si:  Rompe la bolsa.  Tiene contracciones regulares separadas por menos de 17minutos.  Tiene sangrado o pequeas prdidas vaginales.  Siente un dolor abdominal intenso.  Tiene dificultad para respirar.  Siente dolor en el pecho.  Sufre episodios de Kimberly[employer]ark.  No ha sentido a su beb moverse durante el perodo de Agilent Technologies indic el mdico.  Tiene dolor, hinchazn o enrojecimiento nuevos en un brazo o una pierna o se produce un aumento de alguno de estos sntomas. Resumen  El tercer trimestre del Media planner comprende desde la [id number] hasta la semana 81 (desde el mes7 hasta el mes9).  Puede tener ms problemas para dormir. Esto puede deberse al tamao de su abdomen, una mayor necesidad de orinar y un aumento en el metabolismo de su cuerpo.  Le realizarn exmenes prenatales ms frecuentes durante el tercer trimestre. Cumpla con todas las visitas de seguimiento. Esto es importante. Esta informacin no tiene Marine scientist el consejo del mdico. Asegrese de hacerle al mdico cualquier pregunta que tenga. Document Revised: 04/13/2020 Document Reviewed: 04/13/2020 Elsevier Patient Education  2021 [place] anticonceptivo Contraception Choices La anticoncepcin, o los mtodos anticonceptivos, hace referencia a los mtodos o dispositivos que evitan el [place]. Mtodos hormonales Implante anticonceptivo Un implante anticonceptivo consiste en un tubo delgado de plstico que contiene una hormona que evita el [place]. Es diferente de un dispositivo intrauterino (DIU). Un mdico lo inserta en la parte superior del brazo. Los implantes pueden ser eficaces durante un mximo de 3 aos. Inyecciones de progestina sola Las inyecciones de progestina sola contienen progestina, una forma sinttica de la hormona progesterona. Un mdico las administra cada 3  meses. Pldoras anticonceptivas Las pldoras anticonceptivas son pastillas que contienen hormonas que evitan el [place]. Deben tomarse una vez al da, preferentemente a [place]. Se necesita una receta para utilizar este mtodo anticonceptivo. Parche anticonceptivo El parche anticonceptivo contiene hormonas que evitan el [place]. Se coloca en la piel, debe cambiarse una vez a la semana durante tres semanas y debe retirarse en la cuarta semana. Se necesita una receta para utilizar este mtodo anticonceptivo. Anillo vaginal Un anillo vaginal contiene hormonas que evitan el embarazo. Se coloca en la vagina durante tres semanas y se retira en la cuarta semana. Luego se repite el proceso con un anillo nuevo. Se necesita una receta para utilizar este mtodo anticonceptivo. Anticonceptivo de emergencia Los anticonceptivos de emergencia son mtodos para evitar un embarazo despus de Best boy sexo sin proteccin. Vienen en forma de pldora y pueden tomarse hasta 5 das despus de [place]. Funcionan mejor cuando se toman lo ms pronto posible luego de Merrill Lynch. La mayora de los anticonceptivos de emergencia estn disponibles sin receta mdica. Teachers Insurance and Annuity Association  mtodo no debe utilizarse como el nico mtodo anticonceptivo.   Mtodos de barrera Condn masculino Un condn masculino es una vaina delgada que se coloca sobre el pene durante el sexo. Los condones evitan que el esperma ingrese en el cuerpo de la [place]. Pueden utilizarse con un una sustancia que mata a los espermatozoides (espermicida) para aumentar la efectividad. Deben desecharse despus de un uso. Condn femenino Un condn femenino es una vaina blanda y holgada que se coloca en la vagina antes de [place]. El condn evita que el esperma ingrese en el cuerpo de la [place]. Deben desecharse despus de un uso. Diafragma Un diafragma es una barrera blanda con forma de cpula. Se inserta en la vagina antes del sexo, junto con un espermicida. El  diafragma bloquea el ingreso de esperma en el tero, y el espermicida mata a los espermatozoides. El Designer, television/film set en la vagina durante 6 a 8 horas despus de Best boy sexo y debe retirarse en el plazo de las 24 horas. Un diafragma es recetado y colocado por un mdico. Debe reemplazarse cada 1 a 2 aos, despus de dar a luz, de aumentar ms de 15lb (6.8kg) y de [place] plvica. Capuchn cervical Un capuchn cervical es una copa redonda y blanda de ltex o plstico que se coloca en el cuello uterino. Se inserta en la vagina antes del sexo, junto con un espermicida. Bloquea el ingreso del esperma en el tero. El capuchn Medical illustrator durante 6 a 8 horas despus de Best boy sexo y debe retirarse en el plazo de las 48 horas. Un capuchn cervical debe ser recetado y colocado por un mdico. Debe reemplazarse cada 2aos. Esponja Una esponja es una pieza blanda y circular de espuma de poliuretano que contiene espermicida. La esponja ayuda a bloquear el ingreso de esperma en el tero, y el espermicida mata a los espermatozoides. Marylyn Ishihara, debe humedecerla e insertarla en la vagina. Debe insertarse antes de Merrill Lynch, debe permanecer dentro al menos durante 6 horas despus de tener sexo y debe retirarse y Aeronautical engineer en el plazo de las 30 horas. Espermicidas Los espermicidas son sustancias qumicas que matan o bloquean al esperma y no lo dejan ingresar al cuello uterino y al tero. Vienen en forma de crema, gel, supositorio, espuma o comprimido. Un espermicida debe insertarse en la vagina con un aplicador al menos 10 o 15 minutos antes de tener sexo para dar tiempo a que surta [place]. El proceso debe repetirse cada vez que tenga sexo. Los espermicidas no requieren Furniture conservator/restorer.   Anticonceptivos intrauterinos Dispositivo intrauterino (DIU) Un DIU es un dispositivo en forma de T que se coloca en el tero. Existen dos tipos:  DIU hormonal.Este tipo contiene progestina, una forma  sinttica de la hormona progesterona. Este tipo puede permanecer colocado durante 3 a 5 aos.  DIU de cobre.Este tipo est recubierto con un alambre de cobre. Puede permanecer colocado durante 10 aos. Mtodos anticonceptivos permanentes Ligadura de trompas en la mujer En este mtodo, se sellan, atan u obstruyen las trompas de [place] durante una ciruga para Product/process development scientist que el vulo descienda [place]. Esterilizacin histeroscpica En este mtodo, se coloca un implante pequeo y flexible dentro de cada trompa de Falopio. Los implantes hacen que se forme un tejido cicatricial en las trompas de Falopio y que las obstruya para que el espermatozoide no pueda llegar al vulo. El procedimiento demora alrededor de 3 meses para que sea [place]. Debe utilizarse otro mtodo anticonceptivo durante  esos 3 meses. Esterilizacin masculina Este es un procedimiento que consiste en atar los conductos que transportan el esperma (vasectoma). Luego del procedimiento, el hombre Equities trader lquido (semen). Debe utilizarse otro mtodo anticonceptivo durante 3 meses despus del procedimiento. Mtodos de planificacin natural Planificacin familiar natural En este mtodo, la pareja no tiene SPX Corporation la mujer podra quedar [place]. Mtodo calendario En este mtodo, la mujer realiza un seguimiento de la duracin de cada ciclo menstrual, identifica los Mead[employer]vaco que se puede producir un Media planner y no tiene sexo durante esos das. Mtodo de la ovulacin En este mtodo, la pareja evita tener sexo durante la ovulacin. Mtodo sintotrmico Este mtodo implica no tener sexo durante la ovulacin. Normalmente, la mujer comprueba la ovulacin al observar cambios en su temperatura y en la consistencia del moco cervical. Mtodo posovulacin En este mtodo, la pareja espera a que finalice la ovulacin para Adult nurse. Dnde buscar ms informacin  Centers for Disease Control and Prevention (Centros  para el Control y Publishing copy de Arboriculturist): http://www.wolf.info/ Resumen  La anticoncepcin, o los mtodos anticonceptivos, hace referencia a los mtodos o dispositivos que evitan el [place].  Los mtodos anticonceptivos hormonales incluyen implantes, inyecciones, pastillas, parches, anillos vaginales y anticonceptivos de Freight forwarder.  Los mtodos anticonceptivos de barrera pueden incluir condones masculinos, condones femeninos, diafragmas, capuchones cervicales, esponjas y espermicidas.  Sears Holdings Corporation tipos de DIU (dispositivo intrauterino). Un DIU puede colocarse en el tero de una mujer para evitar el embarazo durante 3 a 5 aos.  La esterilizacin permanente puede realizarse mediante un procedimiento tanto en los hombres como en las mujeres. Los NIKE de Marine scientist natural implican no tener SPX Corporation la mujer podra quedar [place]. Esta informacin no tiene Marine scientist el consejo del mdico. Asegrese de hacerle al mdico cualquier pregunta que tenga. Document Revised: 05/08/2020 Document Reviewed: 05/08/2020 Elsevier Patient Education  [place].

## 2020-12-05 NOTE — Progress Notes (Addendum)
Samantha Conner is a 35 y.o.G2P0010 admitted at [redacted]w[redacted]d for IOL for possible cholestasis given diffuse pruritus without rash.  Also with A1GDM.  Subjective: Up walking around the room.  Feeling frequent, painful contractions.  No concerns.    Objective: BP 127/80   Pulse 73   Temp 98.3 F (36.8 C) (Oral)   Resp 16   Ht 4' 11.5" (1.511 m)   Wt 66.8 kg   LMP 03/09/2020   BMI 29.23 kg/m  No intake/output data recorded.  FHT:  FHR: 125 bpm, variability: moderate,  accelerations:  Present,  decelerations:  Absent UC:   q2-3 min  SVE:   Dilation: 7 Effacement (%): 100 Station: -2 Exam by:: Dr. Carlota Raspberry  Pitocin @ 14 mu/min  Labs: Lab Results  Component Value Date   WBC 8.8 12/05/2020   HGB 13.1 12/05/2020   HCT 40.4 12/05/2020   MCV 89.2 12/05/2020   PLT 114 (L) 12/05/2020    Assessment / Plan: 35 y.o. G2P0010 at [redacted]w[redacted]d here for IOL due to concern for cholestasis.  Also with hx of A1GDM.   #Labor: IOL for suspected cholestasis.  Started on pitocin given favorable cervix at presentation to L&D.  Of note, seems to have some thick/hardened tissue on anterior lip of cervix consistent with scar tissue, but she declines prior LEEP/CKC or other procedures.  Continue to titrate pitocin.  #Pain:  Planning natural, but OK with epidural prn per request #FWB: Cat I #ID: GBS, on PCN #MOF: Breast and bottle #MOC: Undecided #Circ: No #Suspected cholestasis: CMP on admission with ALT 54, repeat 57.  Bile acids pending. #G1DM: EFW 86%. Check glucose q2h during active labor.  #Gestational thrombocytopenia:  Admission plt count 107, repeat 114.    [name], MD 12/05/2020, 10:11 PM

## 2020-12-05 NOTE — Progress Notes (Signed)
Pt called at 0824 by Colletta Maryland, RN; no answer.   Called pt at [phone] with interpreter Eda; text link sent to patient, patient given instructions to join MyChart. Pt unable to join [place] visit.   I connected with  Retta Mac Chamu on 12/05/20 at 0915 by telephone with [facility] interpreter Cletus Gash ID [phone] and verified that I am speaking with the correct person using two identifiers.    I discussed the limitations, risks, security and privacy concerns of performing an evaluation and management service by telephone and the availability of in person appointments. The patient expressed understanding and agreed to proceed.  Pt reports itchiness all over the body beginning Monday night; sometimes itching is only in hands and feet. Fast blood glucose this AM was 90.  Annabell Howells, RN 12/05/2020  9:13 AM

## 2020-12-05 NOTE — Progress Notes (Signed)
    TELEHEALTH OBSTETRICS VISIT ENCOUNTER NOTE  Provider location: [facility] [employer] [facility]   I connected with Samantha Conner on 12/05/20 at  8:35 AM EST by telephone at home and verified that I am speaking with the correct person using two identifiers. Of note, unable to do video encounter due to technical difficulties.    I discussed the limitations, risks, security and privacy concerns of performing an evaluation and management service by telephone and the availability of in person appointments. I also discussed with the patient that there may be a patient responsible charge related to this service. The patient expressed understanding and agreed to proceed.  Subjective:  Samantha Conner is a 35 y.o. G2P0010 at [redacted]w[redacted]d being followed for ongoing prenatal care.  She is currently monitored for the following issues for this high-risk pregnancy and has Supervision of high risk pregnancy, antepartum; Gestational diabetes mellitus (GDM), antepartum; Language barrier; and GBS (group B Streptococcus carrier), +RV culture, currently pregnant on their problem list.  Patient reports itching all over body and in particular on hands and feet. Reports fetal movement. Denies any contractions, bleeding or leaking of fluid.   The following portions of the patient's history were reviewed and updated as appropriate: allergies, current medications, past family history, past medical history, past social history, past surgical history and problem list.   Objective:  Blood pressure 117/76, last menstrual period 03/09/2020, unknown if currently breastfeeding. General:  Alert, oriented and cooperative.   Mental Status: Normal mood and affect perceived. Normal judgment and thought content.  Rest of physical exam deferred due to type of encounter  Assessment and Plan:  Pregnancy: G2P0010 at [redacted]w[redacted]d 1. Supervision of high risk pregnancy, antepartum Good fetal movement  2. Diet controlled  gestational diabetes mellitus (GDM), antepartum Reviewed log over the phone Reports fastings are all below 95 Post prandials are all below 120 except for single 150 value  3. GBS (group B Streptococcus carrier), +RV culture, currently pregnant   4. Language barrier Spanish  5. Pruritus Patient reporting new onset pruritus of hands, feet, and body for the past two days She is scheduled for IOL in two days Given she is [redacted]w[redacted]d and bile acid testing will likely take that long to return, discussed risks and benefits and recommended she proceed to IOL today She is amenable to this plan L&D attending Dr. Si Raider aware of and in agreement with plan  Term labor symptoms and general obstetric precautions including but not limited to vaginal bleeding, contractions, leaking of fluid and fetal movement were reviewed in detail with the patient.  I discussed the assessment and treatment plan with the patient. The patient was provided an opportunity to ask questions and all were answered. The patient agreed with the plan and demonstrated an understanding of the instructions. The patient was advised to call back or seek an in-person office evaluation/go to [facility] at [facility] for any urgent or concerning symptoms. Please refer to After Visit Summary for other counseling recommendations.   I provided 10 minutes of non-face-to-face time during this encounter.  Return in 6 weeks (on 01/16/2021).  Future Appointments  Date Time Provider [place]  12/05/2020 11:15 AM MC-SCREENING [facility]-SDSC None  12/07/2020 12:00 AM MC-LD [place] MC-INDC None  12/12/2020  8:35 AM Dione Plover, Annice Needy, MD [facility]    Clarnce Flock, MD [facility] [place] [place]

## 2020-12-05 NOTE — H&P (Addendum)
OBSTETRIC ADMISSION HISTORY AND PHYSICAL  Samantha Conner is a 35 y.o. female G2P0010 with IUP at [redacted]w[redacted]d by LMP=19wk u/s, presenting for IOL for possible cholestasis of pregnancy given diffuse pruritis without rash. Given induction was scheduled in two days for GDM and would take that long to result bile acids, decision was made to admit today. She reports +FMs, No LOF, no VB, no blurry vision, headaches or peripheral edema, and RUQ pain.  She plans on breast and bottle feeding. She is undecided for birth control. She received her prenatal [facility] at [facility]   Dating: By LMP=19wk u/s --->  Estimated Date of Delivery: 12/14/20  Sono: @[redacted]w[redacted]d , CWD, normal anatomy, cephalic presentation, 2423N, 86% EFW   Nursing Staff Provider  Office Location [facility] Dating  Lmp=19wk u/s  Language  Spanish  Anatomy US   [ ]  f/u rpt anatomy u/s  Flu Vaccine  07/27/20 Genetic Screen  afp neg Declined all other   TDaP Vaccine  09/18/20 Hgb A1C or  GTT Early  Third trimester   COVID Vaccine J&J Feb 2021   LAB RESULTS   Rhogam  NA Blood Type --/--/O POS (03/22 0003)   Feeding Plan Br/Bo Antibody NEG (03/22 0003)  Contraception Nexplanon @[facility]  Rubella  imm  Circumcision No RPR   neg  Pediatrician  Undecided  ??  HBsAg   neg  Support Person Husband HCVAb Negative**Positive  Prenatal Classes  HIV     neg  BTL Consent NA GBS   (For PCN allergy, check sensitivities)   VBAC Consent NA Pap  neg 2021    Hgb Electro    BP Cuff Has BP cuff CF     SMA     Waterbirth  [ ]  Class [ ]  Consent [ ]  CNM visit    Induction  [ ]  Orders Entered [ ] Foley Y/N    Prenatal History/Complications: diet controlled GDM, GBS+, concern for cholestasis  Past Medical History: Past Medical History:  Diagnosis Date  . Fibroids   . Gestational diabetes   . PCOS (polycystic ovarian syndrome)     Past Surgical History: Past Surgical History:  Procedure Laterality Date  . NO PAST SURGERIES      Obstetrical History: OB History     Gravida  2   Para  0   Term  0   Preterm  0   AB  1   Living  0     SAB  1   IAB  0   Ectopic  0   Multiple  0   Live Births  0           Social History Social History   Socioeconomic History  . Marital status: Married    Spouse name: Not on file  . Number of children: Not on file  . Years of education: Not on file  . Highest education level: Not on file  Occupational History  . Not on file  Tobacco Use  . Smoking status: Never Smoker  . Smokeless tobacco: Never Used  Vaping Use  . Vaping Use: Never used  Substance and Sexual Activity  . Alcohol use: Never  . Drug use: Never  . Sexual activity: Yes    Birth control/protection: None  Other Topics Concern  . Not on file  Social History Narrative  . Not on file   Social Determinants of Health   Financial Resource Strain: Not on file  Food Insecurity: Food Insecurity Present  . Worried About [employer] of  Food in the Last Year: Sometimes true  . Ran Out of Food in the Last Year: Sometimes true  Transportation Needs: No Transportation Needs  . Lack of Transportation (Medical): No  . Lack of Transportation (Non-Medical): No  Physical Activity: Not on file  Stress: Not on file  Social Connections: Not on file    Family History: Family History  Problem Relation Age of Onset  . Diabetes Neg Hx   . Hypertension Neg Hx     Allergies: No Known Allergies  Medications Prior to Admission  Medication Sig Dispense Refill Last Dose  . aspirin EC 81 MG tablet Take 1 tablet (81 mg total) by mouth daily. 30 tablet 10 12/04/2020 at Unknown time  . glucose blood test strip Use as instructed 100 each 12 12/05/2020 at Unknown time  . Prenatal Vit-DSS-Fe Fum-FA (PRENATAL 19) tablet Take 1 tablet by mouth daily.   12/04/2020 at Unknown time     Review of Systems   All systems reviewed and negative except as stated in HPI  Blood pressure 124/85, pulse 94, resp. rate 16, height 4' 11.5" (1.511 m), weight  66.8 kg, last menstrual period 03/09/2020, unknown if currently breastfeeding. General appearance: alert and no distress Lungs: clear to auscultation bilaterally Heart: regular rate and rhythm Abdomen: soft, non-tender; bowel sounds normal Extremities: Homans sign is negative, no sign of DVT Presentation: cephalic Fetal monitoringBaseline: 140 bpm and Variability: Good {> 6 bpm) Uterine activityFrequency: Every 5 minutes     Prenatal labs: ABO, Rh: --/--/O POS (02/16 1106) Antibody: NEG (02/16 1106) Rubella: Immune (08/23 0000) RPR: Non Reactive (11/30 1058)  HBsAg: Negative (08/23 0000)  HIV: Non Reactive (11/30 1058)  GBS: Positive/-- (01/26 1416)  1 hr Glucola 178 3 hr Glucola 195, 202, 150 (failed) Genetic screening  declined  Prenatal Transfer Tool  Maternal Diabetes: Yes:  Diabetes Type:  Diet controlled Genetic Screening: Declined Maternal Ultrasounds/Referrals: Normal Fetal Ultrasounds or other Referrals:  Referred to Materal Fetal Medicine  Maternal Substance Abuse:  No Significant Maternal Medications:  None Significant Maternal Lab Results: Group B Strep positive  Results for orders placed or performed during the hospital encounter of 12/05/20 (from the past 24 hour(s))  Glucose, capillary   Collection Time: 12/05/20 11:03 AM  Result Value Ref Range   Glucose-Capillary 81 70 - 99 mg/dL  Type and screen   Collection Time: 12/05/20 11:06 AM  Result Value Ref Range   ABO/RH(D) PENDING    Antibody Screen PENDING    Sample Expiration      12/08/2020,2359 Performed at [facility], [address] [place], [place] [address]     Patient Active Problem List   Diagnosis Date Noted  . GDM (gestational diabetes mellitus) 12/05/2020  . GBS (group B Streptococcus carrier), +RV culture, currently pregnant 11/19/2020  . Language barrier 07/06/2020  . Gestational diabetes mellitus (GDM), antepartum 06/28/2020  . Supervision of high risk pregnancy, antepartum  06/19/2020    Assessment/Plan:  Samantha Conner is a 35 y.o. G2P0010 at [redacted]w[redacted]d here for IOL due to concern for cholestasis. Patient had routine prenatal visit today and reported new onset pruritus of hands, feet and body x2 days. Given GA of [redacted]w[redacted]d and it would take days for bile acid testing to return, it was recommended she proceed to IOL today. Hx of diet controlled GDM with sugars at goal, most recent growth scan EFW 86%.  #Labor: IOL for suspected cholestasis, will initiate pitocin given favorable cervix at presentation to L&D  #  Pain: Planning natural, but OK with epidural prn per request #FWB: Cat 1 #ID: GBS, on PCN #MOF: Br/Bo #MOC: Undecided #Circ: No  #Suspected cholestasis- CMP on admission with ALT 54. Will repeat CMP.  #G1DM- admission glucose 81. EFW 86%. Will check q4h glucose during latent labor, q2h during active labor.    Alcus Dad, MD  12/05/2020, 11:47 AM  OB FELLOW HISTORY AND PHYSICAL ATTESTATION  I have seen and examined this patient; I agree with above documentation in the resident's note.   Phill Myron, D.O. OB Fellow  12/05/2020, 3:26 PM

## 2020-12-06 ENCOUNTER — Inpatient Hospital Stay (HOSPITAL_COMMUNITY): Payer: Medicaid Other | Admitting: Anesthesiology

## 2020-12-06 ENCOUNTER — Encounter (HOSPITAL_COMMUNITY)
Admission: AD | Disposition: A | Discharge status: Home / Self Care | Payer: Self-pay | Source: Home / Self Care | Attending: Family Medicine

## 2020-12-06 ENCOUNTER — Encounter (HOSPITAL_COMMUNITY): Payer: Self-pay | Admitting: Family Medicine

## 2020-12-06 DIAGNOSIS — O24420 Gestational diabetes mellitus in childbirth, diet controlled: Secondary | ICD-10-CM

## 2020-12-06 DIAGNOSIS — O99824 Streptococcus B carrier state complicating childbirth: Secondary | ICD-10-CM

## 2020-12-06 DIAGNOSIS — Z3A38 38 weeks gestation of pregnancy: Secondary | ICD-10-CM

## 2020-12-06 LAB — GLUCOSE, CAPILLARY
Glucose-Capillary: 75 mg/dL (ref 70–99)
Glucose-Capillary: 79 mg/dL (ref 70–99)
Glucose-Capillary: 85 mg/dL (ref 70–99)
Glucose-Capillary: 85 mg/dL (ref 70–99)
Glucose-Capillary: 69 mg/dL — ABNORMAL LOW (ref 70–99)

## 2020-12-06 LAB — BILE ACIDS, TOTAL: Bile Acids Total: 37.7 umol/L — ABNORMAL HIGH (ref 0.0–10.0)

## 2020-12-06 LAB — RPR: RPR Ser Ql: NONREACTIVE

## 2020-12-06 SURGERY — Surgical Case
Anesthesia: Epidural

## 2020-12-06 MED ORDER — ACETAMINOPHEN 10 MG/ML IV SOLN
INTRAVENOUS | Status: DC | PRN
  Administered 2020-12-06: 1000 mg via INTRAVENOUS

## 2020-12-06 MED ORDER — FENTANYL CITRATE (PF) 100 MCG/2ML IJ SOLN
INTRAMUSCULAR | Status: AC
  Filled 2020-12-06: qty 2

## 2020-12-06 MED ORDER — ACETAMINOPHEN 10 MG/ML IV SOLN
INTRAVENOUS | Status: AC
  Filled 2020-12-06: qty 100

## 2020-12-06 MED ORDER — FENTANYL CITRATE (PF) 100 MCG/2ML IJ SOLN
INTRAMUSCULAR | Status: DC | PRN
  Administered 2020-12-06: 100 ug via EPIDURAL

## 2020-12-06 MED ORDER — OXYTOCIN-SODIUM CHLORIDE 30-0.9 UT/500ML-% IV SOLN
2.5000 [IU]/h | INTRAVENOUS | Status: AC
  Administered 2020-12-06: 2.5 [IU]/h via INTRAVENOUS
  Filled 2020-12-06: qty 500

## 2020-12-06 MED ORDER — METOCLOPRAMIDE HCL 5 MG/ML IJ SOLN
INTRAMUSCULAR | Status: AC
  Filled 2020-12-06: qty 2

## 2020-12-06 MED ORDER — DIPHENHYDRAMINE HCL 50 MG/ML IJ SOLN: 12.5000 mg | INTRAMUSCULAR | Status: DC | PRN

## 2020-12-06 MED ORDER — PHENYLEPHRINE 40 MCG/ML (10ML) SYRINGE FOR IV PUSH (FOR BLOOD PRESSURE SUPPORT): 80.0000 ug | PREFILLED_SYRINGE | INTRAVENOUS | Status: DC | PRN

## 2020-12-06 MED ORDER — OXYTOCIN-SODIUM CHLORIDE 30-0.9 UT/500ML-% IV SOLN
INTRAVENOUS | Status: DC | PRN
  Administered 2020-12-06: 400 mL via INTRAVENOUS

## 2020-12-06 MED ORDER — OXYCODONE HCL 5 MG PO TABS: 5.0000 mg | ORAL_TABLET | Freq: Once | ORAL | Status: DC | PRN

## 2020-12-06 MED ORDER — KETOROLAC TROMETHAMINE 30 MG/ML IJ SOLN: 30.0000 mg | Freq: Once | INTRAMUSCULAR | Status: DC

## 2020-12-06 MED ORDER — SCOPOLAMINE 1 MG/3DAYS TD PT72
MEDICATED_PATCH | TRANSDERMAL | Status: AC
  Filled 2020-12-06: qty 1

## 2020-12-06 MED ORDER — DIPHENHYDRAMINE HCL 25 MG PO CAPS: 25.0000 mg | ORAL_CAPSULE | Freq: Four times a day (QID) | ORAL | Status: DC | PRN

## 2020-12-06 MED ORDER — EPHEDRINE 5 MG/ML INJ: 10.0000 mg | INTRAVENOUS | Status: DC | PRN

## 2020-12-06 MED ORDER — FENTANYL CITRATE (PF) 100 MCG/2ML IJ SOLN
25.0000 ug | INTRAMUSCULAR | Status: DC | PRN
Start: 2020-12-06 — End: 2020-12-06

## 2020-12-06 MED ORDER — TETANUS-DIPHTH-ACELL PERTUSSIS 5-2.5-18.5 LF-MCG/0.5 IM SUSY: 0.5000 mL | PREFILLED_SYRINGE | Freq: Once | INTRAMUSCULAR | Status: DC

## 2020-12-06 MED ORDER — SIMETHICONE 80 MG PO CHEW
80.0000 mg | CHEWABLE_TABLET | Freq: Three times a day (TID) | ORAL | Status: DC
  Administered 2020-12-06 – 2020-12-08 (×5): 80 mg via ORAL
  Filled 2020-12-06 (×5): qty 1

## 2020-12-06 MED ORDER — ZOLPIDEM TARTRATE 5 MG PO TABS
5.0000 mg | ORAL_TABLET | Freq: Every evening | ORAL | Status: DC | PRN
Start: 2020-12-06 — End: 2020-12-08

## 2020-12-06 MED ORDER — ONDANSETRON HCL 4 MG/2ML IJ SOLN
INTRAMUSCULAR | Status: DC | PRN
  Administered 2020-12-06: 4 mg via INTRAVENOUS

## 2020-12-06 MED ORDER — NALBUPHINE HCL 10 MG/ML IJ SOLN: 5.0000 mg | INTRAMUSCULAR | Status: DC | PRN

## 2020-12-06 MED ORDER — CELECOXIB 100 MG PO CAPS
100.0000 mg | ORAL_CAPSULE | Freq: Two times a day (BID) | ORAL | Status: DC
  Administered 2020-12-06 – 2020-12-08 (×3): 100 mg via ORAL
  Filled 2020-12-06 (×5): qty 1

## 2020-12-06 MED ORDER — WITCH HAZEL-GLYCERIN EX PADS: 1.0000 "application " | MEDICATED_PAD | CUTANEOUS | Status: DC | PRN

## 2020-12-06 MED ORDER — OXYTOCIN-SODIUM CHLORIDE 30-0.9 UT/500ML-% IV SOLN
INTRAVENOUS | Status: AC
  Filled 2020-12-06: qty 500

## 2020-12-06 MED ORDER — NALBUPHINE HCL 10 MG/ML IJ SOLN: 5.0000 mg | Freq: Once | INTRAMUSCULAR | Status: DC | PRN

## 2020-12-06 MED ORDER — ACETAMINOPHEN 500 MG PO TABS
1000.0000 mg | ORAL_TABLET | Freq: Four times a day (QID) | ORAL | Status: DC
  Administered 2020-12-06 – 2020-12-08 (×6): 1000 mg via ORAL
  Filled 2020-12-06 (×7): qty 2

## 2020-12-06 MED ORDER — COCONUT OIL OIL: 1.0000 "application " | TOPICAL_OIL | Status: DC | PRN

## 2020-12-06 MED ORDER — SENNOSIDES-DOCUSATE SODIUM 8.6-50 MG PO TABS
2.0000 | ORAL_TABLET | Freq: Every day | ORAL | Status: DC
  Administered 2020-12-08: 2 via ORAL
  Filled 2020-12-06 (×2): qty 2

## 2020-12-06 MED ORDER — ONDANSETRON HCL 4 MG/2ML IJ SOLN: 4.0000 mg | Freq: Three times a day (TID) | INTRAMUSCULAR | Status: DC | PRN

## 2020-12-06 MED ORDER — OXYCODONE HCL 5 MG/5ML PO SOLN: 5.0000 mg | Freq: Once | ORAL | Status: DC | PRN

## 2020-12-06 MED ORDER — SODIUM CHLORIDE 0.9% FLUSH: 3.0000 mL | INTRAVENOUS | Status: DC | PRN

## 2020-12-06 MED ORDER — OXYCODONE HCL 5 MG PO TABS
5.0000 mg | ORAL_TABLET | ORAL | Status: DC | PRN
  Administered 2020-12-07 – 2020-12-08 (×2): 5 mg via ORAL
  Filled 2020-12-06 (×2): qty 1

## 2020-12-06 MED ORDER — LACTATED RINGERS IV SOLN
500.0000 mL | Freq: Once | INTRAVENOUS | Status: AC
  Administered 2020-12-06: 500 mL via INTRAVENOUS

## 2020-12-06 MED ORDER — SIMETHICONE 80 MG PO CHEW: 80.0000 mg | CHEWABLE_TABLET | ORAL | Status: DC | PRN

## 2020-12-06 MED ORDER — BUPIVACAINE HCL 0.25 % IJ SOLN
INTRAMUSCULAR | Status: DC | PRN
  Administered 2020-12-06: 30 mL

## 2020-12-06 MED ORDER — BUPIVACAINE HCL (PF) 0.25 % IJ SOLN
INTRAMUSCULAR | Status: DC | PRN
  Administered 2020-12-06: 10 mL via EPIDURAL

## 2020-12-06 MED ORDER — MORPHINE SULFATE (PF) 0.5 MG/ML IJ SOLN
INTRAMUSCULAR | Status: AC
  Filled 2020-12-06: qty 10

## 2020-12-06 MED ORDER — SCOPOLAMINE 1 MG/3DAYS TD PT72
1.0000 | MEDICATED_PATCH | Freq: Once | TRANSDERMAL | Status: DC
  Administered 2020-12-06: 1.5 mg via TRANSDERMAL

## 2020-12-06 MED ORDER — LIDOCAINE-EPINEPHRINE (PF) 2 %-1:200000 IJ SOLN
INTRAMUSCULAR | Status: DC | PRN
  Administered 2020-12-06: 10 mL via EPIDURAL

## 2020-12-06 MED ORDER — PRENATAL MULTIVITAMIN CH
1.0000 | ORAL_TABLET | Freq: Every day | ORAL | Status: DC
  Administered 2020-12-08: 1 via ORAL
  Filled 2020-12-06 (×2): qty 1

## 2020-12-06 MED ORDER — MEASLES, MUMPS & RUBELLA VAC IJ SOLR: 0.5000 mL | Freq: Once | INTRAMUSCULAR | Status: DC

## 2020-12-06 MED ORDER — FENTANYL-BUPIVACAINE-NACL 0.5-0.125-0.9 MG/250ML-% EP SOLN
EPIDURAL | Status: AC
  Filled 2020-12-06: qty 250

## 2020-12-06 MED ORDER — DEXAMETHASONE SODIUM PHOSPHATE 4 MG/ML IJ SOLN
INTRAMUSCULAR | Status: AC
  Filled 2020-12-06: qty 1

## 2020-12-06 MED ORDER — ONDANSETRON HCL 4 MG/2ML IJ SOLN
INTRAMUSCULAR | Status: AC
  Filled 2020-12-06: qty 2

## 2020-12-06 MED ORDER — IBUPROFEN 600 MG PO TABS
600.0000 mg | ORAL_TABLET | Freq: Four times a day (QID) | ORAL | Status: DC | PRN
  Filled 2020-12-06 (×2): qty 1

## 2020-12-06 MED ORDER — BUPIVACAINE HCL (PF) 0.25 % IJ SOLN
INTRAMUSCULAR | Status: AC
  Filled 2020-12-06: qty 30

## 2020-12-06 MED ORDER — PROMETHAZINE HCL 25 MG/ML IJ SOLN: 6.2500 mg | INTRAMUSCULAR | Status: DC | PRN

## 2020-12-06 MED ORDER — HYDROMORPHONE HCL 1 MG/ML IJ SOLN: 0.2000 mg | INTRAMUSCULAR | Status: DC | PRN

## 2020-12-06 MED ORDER — LIDOCAINE HCL (PF) 1 % IJ SOLN
INTRAMUSCULAR | Status: DC | PRN
  Administered 2020-12-06: 10 mL via EPIDURAL

## 2020-12-06 MED ORDER — CEFAZOLIN SODIUM-DEXTROSE 2-3 GM-%(50ML) IV SOLR
INTRAVENOUS | Status: DC | PRN
  Administered 2020-12-06: 2 g via INTRAVENOUS

## 2020-12-06 MED ORDER — MEPERIDINE HCL 25 MG/ML IJ SOLN: 6.2500 mg | INTRAMUSCULAR | Status: DC | PRN

## 2020-12-06 MED ORDER — METOCLOPRAMIDE HCL 5 MG/ML IJ SOLN
INTRAMUSCULAR | Status: DC | PRN
  Administered 2020-12-06: 10 mg via INTRAVENOUS

## 2020-12-06 MED ORDER — DIPHENHYDRAMINE HCL 25 MG PO CAPS: 25.0000 mg | ORAL_CAPSULE | ORAL | Status: DC | PRN

## 2020-12-06 MED ORDER — DEXAMETHASONE SODIUM PHOSPHATE 4 MG/ML IJ SOLN
INTRAMUSCULAR | Status: DC | PRN
  Administered 2020-12-06: 8 mg via INTRAVENOUS

## 2020-12-06 MED ORDER — ACETAMINOPHEN 10 MG/ML IV SOLN: 1000.0000 mg | Freq: Once | INTRAVENOUS | Status: DC | PRN

## 2020-12-06 MED ORDER — NALOXONE HCL 4 MG/10ML IJ SOLN
1.0000 ug/kg/h | INTRAMUSCULAR | Status: DC | PRN
  Filled 2020-12-06: qty 5

## 2020-12-06 MED ORDER — FENTANYL-BUPIVACAINE-NACL 0.5-0.125-0.9 MG/250ML-% EP SOLN
12.0000 mL/h | EPIDURAL | Status: DC | PRN
  Administered 2020-12-06: 12 mL/h via EPIDURAL

## 2020-12-06 MED ORDER — NALOXONE HCL 0.4 MG/ML IJ SOLN: 0.4000 mg | INTRAMUSCULAR | Status: DC | PRN

## 2020-12-06 MED ORDER — CEFAZOLIN SODIUM-DEXTROSE 2-4 GM/100ML-% IV SOLN: 2.0000 g | Freq: Once | INTRAVENOUS | Status: DC

## 2020-12-06 MED ORDER — DIBUCAINE (PERIANAL) 1 % EX OINT: 1.0000 "application " | TOPICAL_OINTMENT | CUTANEOUS | Status: DC | PRN

## 2020-12-06 MED ORDER — LACTATED RINGERS IV SOLN: INTRAVENOUS | Status: DC | PRN

## 2020-12-06 MED ORDER — DIPHENHYDRAMINE HCL 50 MG/ML IJ SOLN
12.5000 mg | Freq: Four times a day (QID) | INTRAMUSCULAR | Status: DC | PRN
  Administered 2020-12-06: 12.5 mg via INTRAVENOUS
  Filled 2020-12-06: qty 1

## 2020-12-06 MED ORDER — ENOXAPARIN SODIUM 40 MG/0.4ML ~~LOC~~ SOLN
40.0000 mg | SUBCUTANEOUS | Status: DC
  Administered 2020-12-07 – 2020-12-08 (×2): 40 mg via SUBCUTANEOUS
  Filled 2020-12-06 (×2): qty 0.4

## 2020-12-06 MED ORDER — SODIUM CHLORIDE 0.9 % IV SOLN: 500.0000 mg | INTRAVENOUS | Status: DC

## 2020-12-06 MED ORDER — MENTHOL 3 MG MT LOZG: 1.0000 | LOZENGE | OROMUCOSAL | Status: DC | PRN

## 2020-12-06 SURGICAL SUPPLY — 32 items
BENZOIN TINCTURE PRP APPL 2/3 (GAUZE/BANDAGES/DRESSINGS) ×2 IMPLANT
CLAMP CORD UMBIL (MISCELLANEOUS) ×2 IMPLANT
CLOTH BEACON ORANGE TIMEOUT ST (SAFETY) ×2 IMPLANT
DRSG OPSITE POSTOP 4X10 (GAUZE/BANDAGES/DRESSINGS) ×2 IMPLANT
ELECT REM PT RETURN 9FT ADLT (ELECTROSURGICAL) ×2
ELECTRODE REM PT RTRN 9FT ADLT (ELECTROSURGICAL) ×1 IMPLANT
EXTRACTOR VACUUM M CUP 4 TUBE (SUCTIONS) IMPLANT
GLOVE BIOGEL PI IND STRL 7.0 (GLOVE) ×3 IMPLANT
GLOVE BIOGEL PI INDICATOR 7.0 (GLOVE) ×3
GLOVE ECLIPSE 7.0 STRL STRAW (GLOVE) ×2 IMPLANT
GOWN STRL REUS W/TWL LRG LVL3 (GOWN DISPOSABLE) ×4 IMPLANT
KIT ABG SYR 3ML LUER SLIP (SYRINGE) ×2 IMPLANT
NEEDLE HYPO 22GX1.5 SAFETY (NEEDLE) ×2 IMPLANT
NEEDLE HYPO 25X5/8 SAFETYGLIDE (NEEDLE) ×2 IMPLANT
NS IRRIG 1000ML POUR BTL (IV SOLUTION) ×2 IMPLANT
PACK C SECTION WH (CUSTOM PROCEDURE TRAY) ×2 IMPLANT
PAD ABD 7.5X8 STRL (GAUZE/BANDAGES/DRESSINGS) ×2 IMPLANT
PAD OB MATERNITY 4.3X12.25 (PERSONAL CARE ITEMS) ×2 IMPLANT
PENCIL SMOKE EVAC W/HOLSTER (ELECTROSURGICAL) ×2 IMPLANT
RTRCTR C-SECT PINK 25CM LRG (MISCELLANEOUS) ×2 IMPLANT
SPONGE GAUZE 4X4 12PLY STER LF (GAUZE/BANDAGES/DRESSINGS) ×4 IMPLANT
STRIP CLOSURE SKIN 1/2X4 (GAUZE/BANDAGES/DRESSINGS) ×2 IMPLANT
STRIP CLOSURE SKIN 1/4X4 (GAUZE/BANDAGES/DRESSINGS) ×2 IMPLANT
SUT MNCRL 0 VIOLET CTX 36 (SUTURE) ×2 IMPLANT
SUT MONOCRYL 0 CTX 36 (SUTURE) ×2
SUT VIC AB 0 CTX 36 (SUTURE) ×1
SUT VIC AB 0 CTX36XBRD ANBCTRL (SUTURE) ×1 IMPLANT
SUT VIC AB 4-0 KS 27 (SUTURE) ×2 IMPLANT
SYR 30ML LL (SYRINGE) ×2 IMPLANT
TOWEL OR 17X24 6PK STRL BLUE (TOWEL DISPOSABLE) ×2 IMPLANT
TRAY FOLEY W/BAG SLVR 14FR LF (SET/KITS/TRAYS/PACK) ×2 IMPLANT
WATER STERILE IRR 1000ML POUR (IV SOLUTION) ×2 IMPLANT

## 2020-12-06 NOTE — Anesthesia Procedure Notes (Signed)
Epidural Patient location during procedure: OB Start time: 12/06/2020 5:25 AM End time: 12/06/2020 5:28 AM  Staffing Anesthesiologist: Lyn Hollingshead, MD Performed: anesthesiologist   Preanesthetic Checklist Completed: patient identified, IV checked, site marked, risks and benefits discussed, surgical consent, monitors and equipment checked, pre-op evaluation and timeout performed  Epidural Patient position: sitting Prep: DuraPrep and site prepped and draped Patient monitoring: continuous pulse ox and blood pressure Approach: midline Location: L3-L4 Injection technique: LOR air  Needle:  Needle type: Tuohy  Needle gauge: 17 G Needle length: 9 cm and 9 Needle insertion depth: 5 cm cm Catheter type: closed end flexible Catheter size: 19 Gauge Catheter at skin depth: 9 cm Test dose: negative  Assessment Events: blood not aspirated, injection not painful, no injection resistance, no paresthesia and negative IV test

## 2020-12-06 NOTE — Discharge Instructions (Signed)
Parto por cesrea, cuidados posteriores Cesarean Delivery, Care After Esta hoja le brinda informacin sobre cmo cuidarse despus del procedimiento. El mdico tambin podr darle indicaciones ms especficas. Comunquese con el mdico si tiene problemas o preguntas. Qu puedo esperar despus del procedimiento? Despus del procedimiento, es comn tener los siguientes sntomas:  Una pequea cantidad de sangre o de lquido transparente que proviene de la incisin.  Enrojecimiento, hinchazn y dolor en la zona de la incisin.  Dolor y molestia abdominales.  Hemorragia vaginal (loquios). Aunque no haya tenido parto vaginal, tendr sangrado y secrecin vaginal.  Calambres plvicos.  Fatiga. Es posible que sienta dolor, hinchazn y molestias en el tejido que se encuentra entre la vagina y el ano (perineo) en los siguientes casos:  Si la cesrea no fue planificada y le permitieron realizar el trabajo de parto y pujar.  Si le hicieron una incisin en la zona (episiotoma) o el tejido se desgarr durante el intento de parto vaginal. Siga estas indicaciones en su casa: Cuidados de la incisin  Siga las indicaciones del mdico acerca del cuidado de la incisin. Asegrese de hacer lo siguiente: ? Lvese las manos con agua y jabn antes de cambiar las vendas (vendaje). Use desinfectante para manos si no dispone de agua y jabn. ? Si tiene un vendaje, cmbielo o quteselo siguiendo las indicaciones del mdico. ? No retire los puntos (suturas), las grapas cutneas, la goma para cerrar la piel o las tiras adhesivas. Es posible que estos cierres cutneos deban permanecer en la piel durante 2semanas o ms tiempo. Si los bordes de las tiras adhesivas empiezan a despegarse y enroscarse, puede recortar los que estn sueltos. No retire las tiras adhesivas por completo a menos que el mdico se lo indique.  Controle todos los das la zona de la incisin para detectar signos de infeccin. Est atento a los  siguientes signos: ? Aumento del enrojecimiento, la hinchazn o el dolor. ? Ms lquido o sangre. ? Calor. ? Pus o mal olor.  No tome baos de inmersin, no nade ni use un jacuzzi hasta que el mdico la autorice. Pregntele al mdico si puede ducharse.  Cuando tosa o estornude, abrace una almohada. Esto ayuda con el dolor y disminuye la posibilidad de que su incisin se abra (dehiscencia). Haga esto hasta que la incisin cicatrice.   Medicamentos  Tome los medicamentos de venta libre y los recetados solamente como se lo haya indicado el mdico.  Si le recetaron un antibitico, tmelo como se lo haya indicado el mdico. No deje de tomar el antibitico aunque comience a sentirse mejor.  No conduzca ni use maquinaria pesada mientras toma analgsicos recetados. Estilo de vida  No beba alcohol. Esto es de suma importancia si est amamantando o toma analgsicos.  No consuma ningn producto que contenga nicotina o tabaco, como cigarrillos, cigarrillos electrnicos y tabaco de mascar. Si necesita ayuda para dejar de fumar, consulte al mdico. Comida y bebida  Beba al menos 8vasos de ochoonzas (240cc) de agua todos los das a menos que el mdico le indique lo contrario. Si amamanta, quiz deba beber an ms cantidad de agua.  Coma alimentos ricos en fibras todos los das. Estos alimentos pueden ayudar a prevenir o aliviar el estreimiento. Los alimentos ricos en fibra incluyen los siguientes: ? Panes y cereales integrales. ? Arroz integral. ? Frijoles. ? Frutas y verduras frescas. Actividad  Si es posible, pdale a alguien que la ayude a cuidar del beb y con las tareas del hogar durante al   menos algunos das despus de que le den el alta del hospital.  Retome sus actividades normales como se lo haya indicado el mdico. Pregntele al mdico qu actividades son seguras para usted.  Descanse todo lo que pueda. Trate de descansar o tomar una siesta mientras el beb duerme.  No levante  ningn objeto que pese ms de 10libras (4,5kg) o el lmite de peso que le hayan indicado, hasta que el mdico le diga que puede hacerlo.  Hable con el mdico sobre cundo puede retomar la actividad sexual. Esto puede depender de lo siguiente: ? Riesgo de sufrir una infeccin. ? La rapidez con que cicatrice. ? Comodidad y deseo de retomar la actividad sexual.   Indicaciones generales  No use tampones ni se haga duchas vaginales hasta que el mdico la autorice.  Use ropa floja y cmoda y un sostn firme y que le calce bien.  Mantenga el perineo limpio y seco. Cuando vaya al bao, siempre higiencese de adelante hacia atrs.  Si expulsa un cogulo de sangre, gurdelo y llame al mdico para informrselo. No deseche los cogulos de sangre por el inodoro antes de recibir indicaciones del mdico.  Concurra a todas las visitas de seguimiento para usted y el beb, como se lo haya indicado el mdico. Esto es importante. Comunquese con un mdico si:  Tiene los siguientes sntomas: ? Fiebre. ? Secrecin vaginal con mal olor. ? Pus o mal olor en el lugar de la incisin. ? Dificultad o dolor al orinar. ? Aumento o disminucin repentinos de la frecuencia de las deposiciones. ? Aumento del enrojecimiento, la hinchazn o el dolor alrededor de la incisin. ? Aumento del lquido o sangre proveniente de la incisin. ? Erupcin cutnea. ? Nuseas. ? Poco inters o falta de inters en actividades que solan gustarle. ? Dudas sobre su cuidado y el del beb.  Su incisin se siente caliente al tacto.  Siente dolor en las mamas y se ponen rojas o duras.  Siente tristeza o preocupacin de forma inusual.  Vomita.  Elimina un cogulo de sangre grande por la vagina.  Orina ms de lo habitual.  Se siente mareado o aturdido. Solicite ayuda inmediatamente si:  Tiene los siguientes sntomas: ? Dolor que no desaparece o no mejora con medicamentos. ? Dolor en el pecho. ? Dificultad para  respirar. ? Visin borrosa o manchas en la visin. ? Pensamientos de autolesionarse o lesionar al beb. ? Nuevo dolor en el abdomen o en una de las piernas. ? Dolor de cabeza intenso.  Se desmaya.  Tiene una hemorragia tan intensa en la vagina que empapa ms de un apsito en una hora. El sangrado no debe ser ms abundante que el perodo ms intenso que haya tenido. Resumen  Despus del procedimiento, es comn tener dolor en el lugar de la incisin, clicos abdominales, y sangrado vaginal leve.  Controle todos los das la zona de la incisin para detectar signos de infeccin.  Informe al mdico sobre cualquier sntoma inusual.  Concurra a todas las visitas de seguimiento para usted y el beb, como se lo haya indicado el mdico. Esta informacin no tiene como fin reemplazar el consejo del mdico. Asegrese de hacerle al mdico cualquier pregunta que tenga. Document Revised: 05/19/2018 Document Reviewed: 05/19/2018 Elsevier Patient Education  2021 Elsevier Inc.  

## 2020-12-06 NOTE — Progress Notes (Signed)
Samantha Conner is a 35 y.o.G2P0010 admitted at [redacted]w[redacted]d for IOL for possible cholestasis given diffuse pruritus without rash.  Also with A1GDM.  Subjective: Patient extremely uncomfortable with epidural, thrashing around in bed.  Objective: BP 136/86   Pulse 81   Temp 98.2 F (36.8 C) (Oral)   Resp 19   Ht 4' 11.5" (1.511 m)   Wt 66.8 kg   LMP 03/09/2020   BMI 29.23 kg/m  Total I/O In: 1247.7 [I.V.:1247.7] Out: -   FHT:  FHR: 120 bpm, variability: moderate,  accelerations:  Present,  decelerations:  Absent UC:   q1-4 min  SVE:   Dilation: 8 Effacement (%): 90 Station: -2 Exam by:: Oluwatoyin Banales  Pitocin @ 14 mu/min  Labs: Lab Results  Component Value Date   WBC 8.8 12/05/2020   HGB 13.1 12/05/2020   HCT 40.4 12/05/2020   MCV 89.2 12/05/2020   PLT 114 (L) 12/05/2020    Assessment / Plan: 35 y.o. G2P0010 at [redacted]w[redacted]d here for IOL due to concern for cholestasis.  Also with hx of A1GDM.   #Labor: IOL for suspected cholestasis.  Started on pitocin given favorable cervix at presentation to L&D.  Of note, seems to have some thick/hardened tissue on anterior lip of cervix consistent with scar tissue, but she declines prior LEEP/CKC or other procedures. S/p AROM 0425 with clear fluid. Given minimal cervical change since 0100 IUPC placed at this exam. #Pain: epidural, instructed RN to call anesthesia, patient in significant pain #FWB: Cat I #ID: GBS, on PCN #MOF: Breast and bottle #MOC: Undecided #Circ: No #Suspected cholestasis: CMP on admission with ALT 54, repeat 57.  Bile acids pending. #G1DM: EFW 86%. Check glucose q2h during active labor.  #Gestational thrombocytopenia:  Admission plt count 107, repeat 114.    Arrie Senate, MD 12/06/2020, 6:25 AM

## 2020-12-06 NOTE — Progress Notes (Addendum)
Samantha Conner is a 35 y.o.G2P0010 admitted at [redacted]w[redacted]d for IOL for possible cholestasis given diffuse pruritus without rash.  Also with A1GDM.  Subjective: Strip reviewed.   Objective: BP 127/85   Pulse 85   Temp 97.8 F (36.6 C) (Oral)   Resp 18   Ht 4' 11.5" (1.511 m)   Wt 66.8 kg   LMP 03/09/2020   BMI 29.23 kg/m  Total I/O In: 1247.7 [I.V.:1247.7] Out: -   FHT:  FHR: 125 bpm, variability: moderate,  accelerations:  Present,  decelerations:  Absent UC:   q2-3 min  SVE:   Dilation: 8 Effacement (%): 100 Station: -2 Exam by:: Petra Kuba, RN  Pitocin @ 14 mu/min  Labs: Lab Results  Component Value Date   WBC 8.8 12/05/2020   HGB 13.1 12/05/2020   HCT 40.4 12/05/2020   MCV 89.2 12/05/2020   PLT 114 (L) 12/05/2020    Assessment / Plan: 35 y.o. G2P0010 at [redacted]w[redacted]d here for IOL due to concern for cholestasis.  Also with hx of A1GDM.   #Labor: IOL for suspected cholestasis.  Started on pitocin given favorable cervix at presentation to L&D.  Of note, seems to have some thick/hardened tissue on anterior lip of cervix consistent with scar tissue, but she declines prior LEEP/CKC or other procedures. Continue to titrate pitocin.  #Pain:  Planning natural, but OK with epidural prn per request #FWB: Cat I #ID: GBS, on PCN #MOF: Breast and bottle #MOC: Undecided #Circ: No #Suspected cholestasis: CMP on admission with ALT 54, repeat 57.  Bile acids pending. #G1DM: EFW 86%. Check glucose q2h during active labor.  #Gestational thrombocytopenia:  Admission plt count 107, repeat 114.    [name], MD 12/06/2020, 01:14 AM

## 2020-12-06 NOTE — Progress Notes (Signed)
Patient Vitals for the past 4 hrs:  BP Pulse  12/06/20 1231 112/74 76  12/06/20 1131 128/90 86  12/06/20 1031 136/78 72  12/06/20 1001 121/75 71  12/06/20 0931 112/78 71   Blood sugars all <85.  Cx C/C/+1./  Had a trial of pushing, not much progess, pt wants to labor down.  FHR w/some earlies/mild variables. Moderate variability.

## 2020-12-06 NOTE — Progress Notes (Addendum)
Samantha Conner is a 35 y.o.G2P0010 admitted at 35 y.o. for IOL for possible cholestasis given diffuse pruritus without rash.  Also with A1GDM.  Subjective: A little relief with IV fentanyl.  No other concerns.  Discussed AROM and she is agreeable.   Objective: BP 114/83   Pulse 74   Temp 98.8 F (37.1 C) (Oral)   Resp 17   Ht 4' 11.5" (1.511 m)   Wt 66.8 kg   LMP 03/09/2020   BMI 29.23 kg/m  Total I/O In: 1247.7 [I.V.:1247.7] Out: -   FHT:  FHR: 125 bpm, variability: moderate,  accelerations:  Present,  decelerations:  Absent UC:   q2-4 min  SVE:   Dilation: 8 Effacement (%): 100 Station: -1 Exam by:: Dr. Nyoka Cowden  Pitocin @ 14 mu/min  Labs: Lab Results  Component Value Date   WBC 8.8 12/05/2020   HGB 13.1 12/05/2020   HCT 40.4 12/05/2020   MCV 89.2 12/05/2020   PLT 114 (L) 12/05/2020    Assessment / Plan: 35 y.o. G2P0010 at [redacted]w[redacted]d here for IOL due to concern for cholestasis.  Also with hx of A1GDM.   #Labor: IOL for suspected cholestasis.  Started on pitocin given favorable cervix at presentation to L&D.  Of note, seems to have some thick/hardened tissue on anterior lip of cervix consistent with scar tissue, but she declines prior LEEP/CKC or other procedures. Continue to titrate pitocin.  AROM this check at 0425 with clear fluid.   #Pain: IV pain meds #FWB: Cat I #ID: GBS, on PCN #MOF: Breast and bottle #MOC: Undecided #Circ: No #Suspected cholestasis: CMP on admission with ALT 54, repeat 57.  Bile acids pending. #G1DM: EFW 86%. Check glucose q2h during active labor.  #Gestational thrombocytopenia:  Admission plt count 107, repeat 114.    [name], MD 12/06/2020, 4:31 AM

## 2020-12-06 NOTE — Op Note (Signed)
Preoperative Diagnosis:  IUP @ [redacted]w[redacted]d, Arrest of descent  Postoperative Diagnosis:  Same  Procedure: primary low transverse cesarean section  Surgeon: Darron Doom, M.D.  Assistant: Gavin Pound, CNM An experienced assistant was required given the standard of surgical care given the complexity of the case.  This assistant was needed for exposure, dissection, suctioning, retraction, instrument exchange, assisting with delivery with administration of fundal pressure, and for overall help during the procedure   Findings: Viable female infant, APGAR (1 MIN): 8   APGAR (5 MINS): 9   Weight pending vertex presentation, LOP position and asynclitic  Estimated blood loss: 3716 cc  Complications: None known  Specimens: Placenta to labor and delivery  Reason for procedure: Briefly, the patient is a 35 y.o. G2P0010 [redacted]w[redacted]d who presents for labor. 9 cm at 8 am. Complete and pushing x 3 hours with head at -1 station. Patient declined further attempts at vaginal birth.  Procedure: Patient is a to the OR where spinal analgesia was administered. She was then placed in a supine position with left lateral tilt. She received 2 g of Ancef and Azithromycin and SCDs were in place. A timeout was performed. She was prepped and draped in the usual sterile fashion. A Foley catheter was placed in the bladder. A knife was then used to make a Pfannenstiel incision. This incision was carried out to underlying fascia which was divided in the midline with the knife. The incision was extended laterally, sharply.  The rectus was divided in the midline.  The peritoneal cavity was entered bluntly.  Alexis retractor was placed inside the incision.  A knife was used to make a low transverse incision on the uterus. This incision was carried down to the amniotic cavity was entered. Fetus was in LOP position and was brought up out of the incision without difficulty. Cord was clamped x 2 and cut. Infant taken to waiting nurse.  Cord blood  was obtained. Placenta was delivered from the uterus.  Uterus was cleaned with dry lap pads. Uterine incision closed with 0 Monocryl suture in a locked running fashion. A second layer of 0 Monocryl in an imbricating fashion was used to achieve hemostasis. Alexis retractor was removed from the abdomen. Peritoneal closure was done with 0 Monocryl suture.  Fascia is closed with 0 Vicryl suture in a running fashion. Subcutaneous tissue infused with 30cc 0.25% Marcaine.  Skin closed using 3-0 Vicryl on a Keith needle.  Steri strips applied, followed by pressure dressing.  All instrument, needle and lap counts were correct x 2.  Patient was awake and taken to PACU stable.  Infant remained with mom in couplet care, stable.   Standley Dakins PrattMD 12/06/2020 7:05 PM

## 2020-12-06 NOTE — Progress Notes (Signed)
Patient has been complete and pushing x 3 hours. Has GDM, and baby is -1. Patient is exhausted and declines further attempts at vaginal birth.Will proceed with Cesarean section. Risks include but are not limited to bleeding, infection, injury to surrounding structures, including bowel, bladder and ureters, blood clots, and death.  Likelihood of success is high.

## 2020-12-06 NOTE — Discharge Summary (Signed)
Postpartum Discharge Summary    Patient Name: Samantha Conner DOB: [birth year] MRN: [id number]  Date of admission: 12/05/2020 Delivery date:12/06/2020  Delivering provider: Donnamae Jude  Date of discharge: 12/08/2020  Admitting diagnosis: GDM (gestational diabetes mellitus) [O24.419] Intrauterine pregnancy: [redacted]w[redacted]d    Secondary diagnosis:  Active Problems:   Supervision of high risk pregnancy, antepartum   Gestational diabetes mellitus (GDM), antepartum   Language barrier   GBS (group B Streptococcus carrier), +RV culture, currently pregnant   GDM (gestational diabetes mellitus)   Gestational thrombocytopenia (HDwight   Delivery of pregnancy by cesarean section  Additional problems: iron deficiency anemia of pregnancy     Discharge diagnosis: Term Pregnancy Delivered and GDM A1                                              Post partum procedures:none Augmentation: AROM and Pitocin Complications: None  Hospital course: Induction of Labor With Cesarean Section   35y.o. yo G2P0010 at 379w6das admitted to the hospital 12/05/2020 for induction of labor. Started on Pitocin due to favorable cervix. AROM at 8 cm. Patient had a labor course significant for slow progression and arrest of descent. The patient went for cesarean section due to Arrest of Descent. Delivery details are as follows: Membrane Rupture Time/Date: 4:25 AM ,12/06/2020   Delivery Method:C-Section, Low Transverse  Details of operation can be found in separate operative Note.  Patient had an uncomplicated postpartum course. She is ambulating, tolerating a regular diet, passing flatus, and urinating well.  Patient is discharged home in stable condition on 12/08/20.      Newborn Data: Birth date:12/06/2020  Birth time:6:10 PM  Gender:Female  Living status:Living  Apgars:8 ,9  Weight:3770 g                                 Magnesium Sulfate received: No BMZ received: No Rhophylac:N/A MMR:N/A T-DaP:Given prenatally Flu:  N/A Transfusion:No  Physical exam  Vitals:   12/07/20 1020 12/07/20 1356 12/07/20 2055 12/08/20 0641  BP:  91/64 91/64 106/71  Pulse: 92 71 73 68  Resp:  17 15 16   Temp:  97.6 F (36.4 C) 97.6 F (36.4 C) (!) 97.5 F (36.4 C)  TempSrc:  Oral Oral Oral  SpO2: 100% 100% 100% 100%  Weight:      Height:       General: alert, cooperative and no distress Lochia: appropriate Uterine Fundus: firm Incision: Healing well with no significant drainage, Dressing is clean, dry, and intact DVT Evaluation: No evidence of DVT seen on physical exam. No cords or calf tenderness. No significant calf/ankle edema. Labs: Lab Results  Component Value Date   WBC 12.5 (H) 12/07/2020   HGB 9.5 (L) 12/07/2020   HCT 29.2 (L) 12/07/2020   MCV 90.1 12/07/2020   PLT 108 (L) 12/07/2020   CMP Latest Ref Rng & Units 12/07/2020  Glucose 70 - 99 mg/dL -  BUN 6 - 20 mg/dL -  Creatinine 0.44 - 1.00 mg/dL 0.66  Sodium 135 - 145 mmol/L -  Potassium 3.5 - 5.1 mmol/L -  Chloride 98 - 111 mmol/L -  CO2 22 - 32 mmol/L -  Calcium 8.9 - 10.3 mg/dL -  Total Protein 6.5 - 8.1 g/dL -  Total Bilirubin 0.3 -  1.2 mg/dL -  Alkaline Phos 38 - 126 U/L -  AST 15 - 41 U/L -  ALT 0 - 44 U/L -   Edinburgh Score: Edinburgh Postnatal Depression Scale Screening Tool 12/06/2020  I have been able to laugh and see the funny side of things. 0  I have looked forward with enjoyment to things. 0  I have blamed myself unnecessarily when things went wrong. 0  I have been anxious or worried for no good reason. 0  I have felt scared or panicky for no good reason. 0  Things have been getting on top of me. 0  I have been so unhappy that I have had difficulty sleeping. 0  I have felt sad or miserable. 0  I have been so unhappy that I have been crying. 0  The thought of harming myself has occurred to me. 0  Edinburgh Postnatal Depression Scale Total 0     After visit meds:  Allergies as of 12/08/2020   No Known Allergies      Medication List    STOP taking these medications   aspirin EC 81 MG tablet   glucose blood test strip     TAKE these medications   ferrous sulfate 325 (65 FE) MG tablet Take 1 tablet (325 mg total) by mouth every other day.   oxyCODONE 5 MG immediate release tablet Commonly known as: Oxy IR/ROXICODONE Take 1 tablet (5 mg total) by mouth every 6 (six) hours as needed for moderate pain.   Prenatal 19 tablet Take 1 tablet by mouth daily.        Discharge home in stable condition Infant Feeding: Bottle and Breast Infant Disposition:home with mother Discharge instruction: per After Visit Summary and Postpartum booklet. Activity: Advance as tolerated. Pelvic rest for 6 weeks.  Diet: routine diet Future Appointments: Future Appointments  Date Time Provider [place]  12/13/2020 10:00 AM [facility] NURSE [facility]  01/17/2021  8:35 AM Aletha Halim, MD [facility]   Follow up Visit:  [facility] Follow up in 4 week(s).   Specialty: Obstetrics and Gynecology Why: Make appointment to be seen in 1 week for incision check and 4 weeks for postpartum appointment  Contact information: [place] [phone] [phone]               Please schedule this patient for a In person postpartum visit in 4 weeks with the following provider: Any provider. Additional Postpartum F/U:Incision check 1 week  High risk pregnancy complicated by: GDM Delivery mode:  C-Section, Low Transverse  Anticipated Birth Control:  Nexplanon at [facility]   12/08/2020 Lajean Manes, CNM

## 2020-12-06 NOTE — Progress Notes (Addendum)
Samantha Conner is a 35 y.o.G2P0010 admitted at [redacted]w[redacted]d for IOL for possible cholestasis given diffuse pruritus without rash.  Also with A1GDM.  Subjective: Much more uncomfortable.  Desires IV pain meds.  Still unsure about epidural.  No other concerns.   Objective: BP 127/85   Pulse 85   Temp 97.8 F (36.6 C) (Oral)   Resp 18   Ht 4' 11.5" (1.511 m)   Wt 66.8 kg   LMP 03/09/2020   BMI 29.23 kg/m  Total I/O In: 1247.7 [I.V.:1247.7] Out: -   FHT:  FHR: 150 bpm, variability: moderate,  accelerations:  Present,  decelerations:  Absent UC:   q2-3 min  SVE:   Dilation: 8 Effacement (%): 100 Station: -2 Exam by:: Dr. Nyoka Cowden  Pitocin @ 14 mu/min  Labs: Lab Results  Component Value Date   WBC 8.8 12/05/2020   HGB 13.1 12/05/2020   HCT 40.4 12/05/2020   MCV 89.2 12/05/2020   PLT 114 (L) 12/05/2020    Assessment / Plan: 35 y.o. G2P0010 at [redacted]w[redacted]d here for IOL due to concern for cholestasis.  Also with hx of A1GDM.   #Labor: IOL for suspected cholestasis.  Started on pitocin given favorable cervix at presentation to L&D.  Of note, seems to have some thick/hardened tissue on anterior lip of cervix consistent with scar tissue, but she declines prior LEEP/CKC or other procedures. Continue to titrate pitocin.  AROM next check. #Pain:  IV Fentanyl now.  Aware that will likely not repeat after this dose given that she is nearing delivery.  Still unsure about epidural. #FWB: Cat I #ID: GBS, on PCN #MOF: Breast and bottle #MOC: Undecided #Circ: No #Suspected cholestasis: CMP on admission with ALT 54, repeat 57.  Bile acids pending. #G1DM: EFW 86%. Check glucose q2h during active labor.  #Gestational thrombocytopenia:  Admission plt count 107, repeat 114.    [name], MD 12/06/2020, 3:27 AM

## 2020-12-06 NOTE — Anesthesia Preprocedure Evaluation (Signed)
Anesthesia Evaluation  Patient identified by MRN, date of birth, ID band Patient awake    Reviewed: Allergy & Precautions, H&P , Patient's Chart, lab work & pertinent test results, reviewed documented beta blocker date and time   Airway Mallampati: I       Dental no notable dental hx.    Pulmonary neg pulmonary ROS,    Pulmonary exam normal        Cardiovascular negative cardio ROS Normal cardiovascular exam     Neuro/Psych negative neurological ROS  negative psych ROS   GI/Hepatic negative GI ROS, Neg liver ROS,   Endo/Other  negative endocrine ROSdiabetes, Gestational  Renal/GU negative Renal ROS  negative genitourinary   Musculoskeletal negative musculoskeletal ROS (+)   Abdominal Normal abdominal exam  (+)   Peds  Hematology negative hematology ROS (+)   Anesthesia Other Findings   Reproductive/Obstetrics (+) Pregnancy                             Anesthesia Physical Anesthesia Plan  ASA: II  Anesthesia Plan: Epidural   Post-op Pain Management:    Induction:   PONV Risk Score and Plan:   Airway Management Planned:   Additional Equipment:   Intra-op Plan:   Post-operative Plan:   Informed Consent: I have reviewed the patients History and Physical, chart, labs and discussed the procedure including the risks, benefits and alternatives for the proposed anesthesia with the patient or authorized representative who has indicated his/her understanding and acceptance.       Plan Discussed with: CRNA  Anesthesia Plan Comments:         Anesthesia Quick Evaluation

## 2020-12-06 NOTE — Progress Notes (Signed)
Patient Vitals for the past 4 hrs:  BP Temp Temp src Pulse Resp  12/06/20 1401 133/80 -- -- 67 --  12/06/20 1331 119/76 98.9 F (37.2 C) Oral 73 17  12/06/20 1301 114/73 -- -- 75 --  12/06/20 1231 112/74 -- -- 76 --   Pt has been pushing about 1.5 hours, making some progress.  ? OP.  Has been changing positions a lot. FHR w/some mild variables, back to baseline in between pushes.  Moderate variability. vtx at +2 w/some moulding, but feels roomy enough.  Will continue pushing . Dr. Rip Harbour aware.

## 2020-12-06 NOTE — Transfer of Care (Signed)
Immediate Anesthesia Transfer of Care Note  Patient: Samantha Conner  Procedure(s) Performed: CESAREAN SECTION  Patient Location: PACU  Anesthesia Type:Epidural  Level of Consciousness: awake, alert , oriented and patient cooperative  Airway & Oxygen Therapy: Patient Spontanous Breathing  Post-op Assessment: Report given to RN and Post -op Vital signs reviewed and stable  Post vital signs: Reviewed and stable  Last Vitals:  Vitals Value Taken Time  BP 93/81 12/06/20 1850  Temp 36.7 C 12/06/20 1854  Pulse 87 12/06/20 1900  Resp 22 12/06/20 1900  SpO2 99 % 12/06/20 1900  Vitals shown include unvalidated device data.  Last Pain:  Vitals:   12/06/20 1850  TempSrc:   PainSc: 0-No pain         Complications: No complications documented.

## 2020-12-07 ENCOUNTER — Encounter (HOSPITAL_COMMUNITY): Payer: Self-pay | Admitting: Family Medicine

## 2020-12-07 ENCOUNTER — Inpatient Hospital Stay (HOSPITAL_COMMUNITY): Payer: Medicaid Other

## 2020-12-07 LAB — CBC
HCT: 29.2 % — ABNORMAL LOW (ref 36.0–46.0)
Hemoglobin: 9.5 g/dL — ABNORMAL LOW (ref 12.0–15.0)
MCH: 29.3 pg (ref 26.0–34.0)
MCHC: 32.5 g/dL (ref 30.0–36.0)
MCV: 90.1 fL (ref 80.0–100.0)
Platelets: 108 10*3/uL — ABNORMAL LOW (ref 150–400)
RBC: 3.24 MIL/uL — ABNORMAL LOW (ref 3.87–5.11)
RDW: 14.7 % (ref 11.5–15.5)
WBC: 12.5 10*3/uL — ABNORMAL HIGH (ref 4.0–10.5)
nRBC: 0 % (ref 0.0–0.2)

## 2020-12-07 LAB — CREATININE, SERUM
Creatinine, Ser: 0.66 mg/dL (ref 0.44–1.00)
GFR, Estimated: >60 mL/min (ref >60)

## 2020-12-07 LAB — GLUCOSE, CAPILLARY: Glucose-Capillary: 112 mg/dL — ABNORMAL HIGH (ref 70–99)

## 2020-12-07 MED ORDER — LACTATED RINGERS IV SOLN: Freq: Once | INTRAVENOUS | Status: DC

## 2020-12-07 NOTE — Progress Notes (Addendum)
Use interper line @ 2020 Idamay [id number] roxanna [id number] ,called interper @0040  gaby [phone]

## 2020-12-07 NOTE — Progress Notes (Signed)
Post-Op Day 1, Primary CS for arrest of descent  Subjective: No complaints, up ad lib, feels a little dizzy when she gets up; voiding and tolerating PO, passing flatus,small lochia,.plans to breastfeed, Nexplanon at HD  Objective: Blood pressure 112/73, pulse 75, temperature 97.7 F (36.5 C), temperature source Oral, resp. rate 18, height 4' 11.5" (1.511 m), weight 66.8 kg, last menstrual period 03/09/2020, SpO2 (!) 66 %, unknown if currently breastfeeding.   (recorded O2 sat of 66% inaccurate) Physical Exam:  General: alert, cooperative and no distress Lochia:normal flow Chest: CTAB Heart: RRR no m/r/g Abdomen: +BS, soft, nontender, dsg c/d/intact Uterine Fundus: firm DVT Evaluation: No evidence of DVT seen on physical exam. Extremities: trace edema  Recent Labs    12/05/20 1851 12/07/20 0511  HGB 13.1 9.5*  HCT 40.4 29.2*    Assessment/Plan: Ask for help when getting up until no longer dizzy    LOS: 2 days   Christin Fudge 12/07/2020, 7:39 AM

## 2020-12-07 NOTE — Lactation Note (Signed)
This note was copied from a baby's chart. Lactation Consultation Note Baby 5 hrs old at time of consult. RN Cleda Daub being Spanish interpreter for [facility]. Mom having trouble latching. Mom has heavy breast. Mom has received a lot of fluids being 2 days in labor. LC massaged breast and obtained a dot of thick colostrum. Mom's Lt. Nipple a little large but the baby is able to latch.  Baby BF well w/no swallows heard but breast a little bit softer. D/t GDM baby is getting DBM after BF.  Mom's feeding choice is breast/formula.  Newborn feeding habits and behavior, STS, I&O, breast massage discussed. Mom started feeling dizzy. Heeia laid mom backwards. RN stated mom has been feeling dizzy at times. VS and bleeding WDL. Mom is very tired. FOB at bedside watching baby.  Encouraged to call if needs assistance.  Patient Name: Samantha Conner OTRRN'H Date: 12/07/2020 Reason for consult: Initial assessment;Primapara;Early term 37-38.6wks;Maternal endocrine disorder Age:40 hours  Maternal Data Has patient been taught Hand Expression?: Yes Does the patient have breastfeeding experience prior to this delivery?: No  Feeding    LATCH Score Latch: Grasps breast easily, tongue down, lips flanged, rhythmical sucking.  Audible Swallowing: None  Type of Nipple: Everted at rest and after stimulation  Comfort (Breast/Nipple): Soft / non-tender  Hold (Positioning): Full assist, staff holds infant at breast  LATCH Score: 6   Lactation Tools Discussed/Used    Interventions Interventions: Breast feeding basics reviewed;Adjust position;Assisted with latch;Support pillows;Skin to skin;Position options;Breast massage;Hand express;Breast compression  Discharge WIC Program: Yes  Consult Status Consult Status: Follow-up Date: 12/07/20 Follow-up type: In-patient    Theodoro Kalata 12/07/2020, 12:40 AM

## 2020-12-07 NOTE — Progress Notes (Addendum)
Patient complaining of dizziness. She is very tired and has gotten little sleep. Will re-assess at her next 1 hr check to see if I can get her up and do orthos. I encouraged patient to rest in the meantime.

## 2020-12-07 NOTE — Progress Notes (Signed)
Pt states she feels a little better this morning. I was able to do orthos and stand patient on the side of the bed. When I attempted to ambulate patient she stated that she was starting to feel dizzy again so I sat her back down and she asked to lay down. Pt CBG this morning was 112, VS still WDL.

## 2020-12-07 NOTE — Progress Notes (Addendum)
Patient still dizzy. Pt also states her eyes feel heavy and hard to keep open. She is very sleepy. I told her I would hold off on orthostatics and getting up since she is feeling so tired and dizzy. Her bleeding is scant, fundus firm at U, vital signs stable, good urinary output. Will let her rest throughout the night and reassess in the morning. If she is still dizzy then, I will notify MD.

## 2020-12-07 NOTE — Anesthesia Postprocedure Evaluation (Signed)
Anesthesia Post Note  Patient: Samantha Conner  Procedure(s) Performed: CESAREAN SECTION     Patient location during evaluation: PACU Anesthesia Type: Epidural Level of consciousness: awake Pain management: pain level controlled Vital Signs Assessment: post-procedure vital signs reviewed and stable Respiratory status: spontaneous breathing Cardiovascular status: stable Postop Assessment: no headache, no backache, epidural receding, patient able to bend at knees and no apparent nausea or vomiting Anesthetic complications: no   No complications documented.  Last Vitals:  Vitals:   12/06/20 2344 12/07/20 0320  BP: 122/87 112/73  Pulse: 73 75  Resp: 16 18  Temp: 36.6 C 36.5 C  SpO2:      Last Pain:  Vitals:   12/07/20 0320  TempSrc: Oral  PainSc: 0-No pain   Pain Goal:                   Huston Foley

## 2020-12-07 NOTE — Lactation Note (Signed)
This note was copied from a baby's chart. Lactation Consultation Note  Patient Name: Boy Emanuela Runnion Today's Date: 12/07/2020   Age:33 hours  P1 mother whose infant is now 27 hours old.  This is an ETI at 38+6 weeks.  Mother's feeding preference is breast/bottle.  Spanish interpreter [phone]) used for interpretation.  Baby was swaddled and asleep in the bassinet when I arrived.  Mother had no breast feeding related questions, however, she was interested in knowing what to do to get her milk to "come in."  Reviewed lactogenesis II with mother.  Discussed breast massage and hand expression along with frequent feeding.  Reminded mother to put baby to the breast with every feeding prior to giving any formula supplementation.  Mother verbalized understanding.  Formula supplementation guidelines are appropriate at this time.  Father questioning about foods to avoid after cesarean section since he will be doing the cooking at home.  Explained that there are no food restrictions since baby has been delivered.  Mother will continue to feed and supplement after feedings with formula.  She will call for assistance as needed.  Mother has a DEBP for home use.  Family receptive to teaching.   Maternal Data    Feeding Nipple Type: Slow - flow  LATCH Score                    Lactation Tools Discussed/Used    Interventions    Discharge    Consult Status Consult Status: Follow-up Date: 12/08/20 Follow-up type: In-patient    Little Ishikawa 12/07/2020, 4:34 PM

## 2020-12-08 MED ORDER — ACETAMINOPHEN 500 MG PO TABS: 1000.0000 mg | ORAL_TABLET | Freq: Four times a day (QID) | ORAL | 0 refills | Status: AC | PRN

## 2020-12-08 MED ORDER — OXYCODONE HCL 5 MG PO TABS: 5.0000 mg | ORAL_TABLET | Freq: Four times a day (QID) | ORAL | 0 refills | Status: DC | PRN

## 2020-12-08 MED ORDER — FERROUS SULFATE 325 (65 FE) MG PO TABS
325.0000 mg | ORAL_TABLET | ORAL | Status: DC
  Administered 2020-12-08: 325 mg via ORAL
  Filled 2020-12-08: qty 1

## 2020-12-08 MED ORDER — FERROUS SULFATE 325 (65 FE) MG PO TABS: 325.0000 mg | ORAL_TABLET | ORAL | 1 refills | Status: AC

## 2020-12-08 MED ORDER — FERROUS SULFATE 325 (65 FE) MG PO TABS: 325.0000 mg | ORAL_TABLET | ORAL | 1 refills | Status: DC

## 2020-12-08 MED ORDER — COCONUT OIL OIL: 1.0000 "application " | TOPICAL_OIL | 0 refills | Status: AC | PRN

## 2020-12-08 MED ORDER — IBUPROFEN 600 MG PO TABS: 600.0000 mg | ORAL_TABLET | Freq: Four times a day (QID) | ORAL | 0 refills | Status: DC | PRN

## 2020-12-08 NOTE — Lactation Note (Signed)
This note was copied from a baby's chart. Lactation Consultation Note  Patient Name: Samantha Conner WLSLH'T Date: 12/08/2020 Reason for consult: Follow-up assessment;Early term 37-38.6wks;Primapara;1st time breastfeeding;Maternal endocrine disorder;Infant weight loss Age:33 hours  Visited with mom of 21 hours old ETI female, she's a P1. Mom and baby are going home today, baby is at 5% weight loss. Reviewed discharge instructions, engorgement prevention/treatment, and treatment/prevention of sore nipples. Mom is supplementing baby with Dory Horn 20 calorie formula, she has a pump at home. Urged to pump every time baby is getting formula to protect her supply. Reviewed normal newborn behavior, lactogenesis II and supplementation guidelines. Encouraged 8-12 feedings on cues in 24 hours. FOB present and supportive. Parents reported all questions and concerns were answered, they're both aware of [place] OP services and will call PRN.  Maternal Data    Feeding Mother's Current Feeding Choice: Breast Milk and Formula  LATCH Score    Lactation Tools Discussed/Used    Interventions Interventions: Breast feeding basics reviewed  Discharge Discharge Education: Engorgement and breast care;Warning signs for feeding baby  Consult Status Consult Status: Complete Date: 12/08/20 Follow-up type: Call as needed    Lakecia Deschamps Francene Boyers 12/08/2020, 2:41 PM

## 2020-12-08 NOTE — Progress Notes (Addendum)
Went in room to give morning medication and take iv out of patient arm. Patient states that she took the iv out 10 minutes ago  Per interper. Previously told patient that the iv would be remove this morning with with an interper.Patient  Stated that she understood. IV states was not swollen nor red. Iv cather was intact. Merry Lofty [phone]

## 2020-12-12 ENCOUNTER — Encounter: Payer: Self-pay | Admitting: Family Medicine

## 2020-12-13 ENCOUNTER — Ambulatory Visit (INDEPENDENT_AMBULATORY_CARE_PROVIDER_SITE_OTHER): Payer: Self-pay

## 2020-12-13 ENCOUNTER — Other Ambulatory Visit: Payer: Self-pay

## 2020-12-13 VITALS — BP 113/83 | HR 89 | Wt 128.2 lb

## 2020-12-13 DIAGNOSIS — Z5189 Encounter for other specified aftercare: Secondary | ICD-10-CM

## 2020-12-13 NOTE — Progress Notes (Signed)
Pt here today for incision check s/p primary c-section on 12/06/20.  Video Interpreter # [phone] utilized.  Pt reports mild pain at incision site and no vaginal bleeding. Tylenol is effective for the pain.  Removed honeycomb dressing and steri strips.  Incision well approximated- no odor, no drainage, no erythema, and mild edema.  Pt advised to allow warm, soapy water to run over incision and pat dry.  Pt also advised to contact office with concerns of infection and/or questions. We will f/u with her at her pp visit scheduled on 01/22/21.  Pt verbalized understanding with no further questions.   Mel Almond, RN  12/13/20

## 2020-12-13 NOTE — Progress Notes (Signed)
Chart reviewed for nurse visit. Agree with plan of care.   Luvenia Redden, PA-C 12/13/2020 11:03 AM

## 2020-12-18 ENCOUNTER — Ambulatory Visit (INDEPENDENT_AMBULATORY_CARE_PROVIDER_SITE_OTHER): Payer: Self-pay | Admitting: *Deleted

## 2020-12-18 ENCOUNTER — Telehealth: Payer: Self-pay

## 2020-12-18 ENCOUNTER — Encounter: Payer: Self-pay | Admitting: *Deleted

## 2020-12-18 VITALS — BP 113/83 | HR 98

## 2020-12-18 DIAGNOSIS — Z4889 Encounter for other specified surgical aftercare: Secondary | ICD-10-CM

## 2020-12-18 NOTE — Progress Notes (Signed)
Chart reviewed for nurse visit. Agree with plan of care.   Clarnce Flock, MD 12/18/20 8:49 PM

## 2020-12-18 NOTE — Progress Notes (Signed)
Video interpreter Regino Schultze [phone] used for encounter. Pt is here for evaluation due to report earlier today of white discharge coming from her C/S incision. Assessment of incision reveals an area @ the center which is about 1 inch in length where the skin edges are not well approximated. Scant amount of clear drainage noted, however none was expressed when pressure was applied. Dr. Dione Plover in to examine wound. He recommends pt to apply gauze w/vaseline to open wound area once daily. Pt was advised of gentle cleansing technique daily in shower. Pt to return in 2 weeks for incision check. She voiced understanding of all information and instructions given. Due to documented food insecurity, pt was escorted to the [employer].

## 2020-12-18 NOTE — Telephone Encounter (Signed)
Pt called to report new white discharge draining from c-section incision and requests an appt. Pt added to nurse schedule for 2:20 PM. Pt called with interpreter Raquel and appt time given.

## 2020-12-19 ENCOUNTER — Ambulatory Visit: Payer: Self-pay

## 2021-01-01 ENCOUNTER — Ambulatory Visit (INDEPENDENT_AMBULATORY_CARE_PROVIDER_SITE_OTHER): Payer: Self-pay

## 2021-01-01 ENCOUNTER — Other Ambulatory Visit: Payer: Self-pay

## 2021-01-01 VITALS — BP 115/81 | HR 76 | Wt 128.9 lb

## 2021-01-01 DIAGNOSIS — Z5189 Encounter for other specified aftercare: Secondary | ICD-10-CM

## 2021-01-01 NOTE — Patient Instructions (Signed)
Please purchase Miralax (Polyethylene glycol 3350) powder over the counter to add to water or juice to help constipation.

## 2021-01-01 NOTE — Progress Notes (Signed)
Chart reviewed for nurse visit. Agree with plan of care.   Clarnce Flock, MD 01/01/21 5:02 PM

## 2021-01-01 NOTE — Progress Notes (Signed)
Pt is 3w 5d PP following primary c-section here for repeat incision check. Interpreter Marianna Fuss from [place] present for entire encounter. Incision is open to air, appears clean; multiple areas of pink, healing tissue. Minimal amount of redness to surrounding area. Multiple sutures visible. Pt endorses pain to the area currently 3/10. Reviewed good wound care and s/s of infection with pt. Dione Plover, MD to bedside for assessment; visible sutures removed by MD.   Pt reports some constipation, states she is eating raisins and drinking water to help this. Encouraged pt to try OTC Miralax. Explained this could be increasing amount of pain. Also encouraged pt to try rotating ibuprofen and Tylenol if needed for pain management.  Pt will follow up at [facility] appt on 01/22/21 with Dione Plover, MD.   Apolonio Schneiders RN 01/01/21

## 2021-01-10 ENCOUNTER — Other Ambulatory Visit: Payer: Self-pay | Admitting: Advanced Practice Midwife

## 2021-01-10 DIAGNOSIS — O039 Complete or unspecified spontaneous abortion without complication: Secondary | ICD-10-CM

## 2021-01-16 ENCOUNTER — Encounter: Payer: Self-pay | Admitting: General Practice

## 2021-01-17 ENCOUNTER — Ambulatory Visit: Payer: Self-pay | Admitting: Obstetrics and Gynecology

## 2021-01-21 ENCOUNTER — Other Ambulatory Visit: Payer: Self-pay | Admitting: Lactation Services

## 2021-01-21 DIAGNOSIS — O24410 Gestational diabetes mellitus in pregnancy, diet controlled: Secondary | ICD-10-CM

## 2021-01-22 ENCOUNTER — Other Ambulatory Visit: Payer: Self-pay

## 2021-01-22 ENCOUNTER — Encounter: Payer: Self-pay | Admitting: Family Medicine

## 2021-01-22 ENCOUNTER — Ambulatory Visit (INDEPENDENT_AMBULATORY_CARE_PROVIDER_SITE_OTHER): Payer: Self-pay | Admitting: Family Medicine

## 2021-01-22 DIAGNOSIS — O24410 Gestational diabetes mellitus in pregnancy, diet controlled: Secondary | ICD-10-CM

## 2021-01-22 NOTE — Patient Instructions (Signed)
Parto vaginal, cuidados de puerperio Postpartum Care After Vaginal Delivery La siguiente informacin ofrece una gua sobre cmo cuidarse desde el momento en que nazca su beb y Waylan Boga 6 a 12 semanas despus del parto (perodo del posparto). Si tiene problemas o preguntas, pngase en contacto con su mdico para obtener instrucciones ms especficas. Siga estas instrucciones en su casa: Hemorragia vaginal  Es normal tener un poco de hemorragia vaginal (loquios) despus del parto. Use un apsito sanitario para el sangrado y secrecin. ? Durante la primera semana despus del parto, la cantidad y el aspecto de los loquios a menudo es similar a los del perodo menstrual. ? Durante las siguientes semanas disminuir gradualmente hasta convertirse en una secrecin seca amarronada o [place]. ? En la [employer], los loquios se detienen [employer] 4 a 6semanas despus del parto, pero esto puede variar.  Cambie los apsitos sanitarios con frecuencia. Observe si hay cambios en el flujo, como: ? Aumento repentino en el volumen. ? Cambio en el color. ? Cogulos de [employer].  Si expulsa un cogulo de sangre por la vagina, gurdelo y llame al mdico. No deseche los cogulos de sangre por el inodoro antes de hablar con su mdico.  No use tampones ni se haga duchas vaginales hasta que el mdico la autorice.  Si no est amamantando, volver a tener su perodo entre 6 y 52 semanas despus del parto. Si solamente alimenta al beb con [employer], podra no volver a tener su perodo hasta que deje de [place]. Cuidados perineales  Mantenga la zona entre la vagina y el ano (perineo) limpia y Audiological scientist. Utilice apsitos o [employer] y Proofreader, como se lo hayan indicado.  Si le hicieron un corte quirrgico en el perineo (episiotoma) o tuvo un desgarro, controle la zona para detectar signos de infeccin hasta que sane. Est atenta a los siguientes signos: ? Aumento del  enrojecimiento, la hinchazn o Conservation officer, historic buildings. ? Presenta lquido o sangre que supura del corte o Tree surgeon. ? Calor. ? Pus o mal olor.  Es posible que le den una botella rociadora para que use en lugar de limpiarse el rea con papel higinico despus de usar el bao. Seque la zona dando golpecitos suaves.  Para aliviar el dolor causado por una episiotoma, un desgarro o venas hinchadas en el ano (hemorroides), tome un bao de asiento tibio 2 o 3 veces por da. En un bao de asiento, el agua tibia solamente debe llegar [employer] caderas y [employer].   [place] despus del parto, las mamas pueden sentirse pesadas, llenas e incmodas (congestin [place]). Tambin puede escaparse leche de sus senos. Pdale al mdico que le sugiera formas de Runner, broadcasting/film/video.  Si est amamantando: ? Use un sostn que sujete las mamas y [place]. Utilice protectores mamarios para Civil Service fast streamer Health Net se filtre. ? Mantenga los pezones secos y limpios. Aplique cremas y [employer] se lo hayan indicado. ? Puede tener contracciones uterinas cada vez que amamante durante varias semanas despus del [place]. Esto ayuda a que el tero vuelva a su tamao normal. ? Si tiene algn problema con la lactancia materna, infrmelo al mdico o a un Lobbyist.  Si no est amamantando: ? Evite tocarse las mamas. No extraiga (saque) [employer]. Al hacerlo, podran producir ms leche. ? Use un sostn que le proporcione el ajuste correcto y compresas fras para reducir la hinchazn. Intimidad y sexualidad  Pregntele al mdico cundo puede retomar la actividad sexual. Esto puede depender de lo siguiente: ? Su riesgo de sufrir infecciones. ? La rapidez con la que est sanando. ? Su comodidad y deseo de retomar la actividad sexual.  Despus del parto, puede quedar embarazada incluso si no ha tenido todava su perodo. Hable con el mdico acerca de los mtodos de control de la  natalidad (mtodos anticonceptivos) o planificacin familiar si desea Product manager en el futuro. Medicamentos  Use los medicamentos de venta libre y los recetados solamente como se lo haya indicado el [place] un laxante de venta libre para ayudar con las deposiciones como se lo haya indicado el mdico.  Si le recetaron un antibitico, tmelo como se lo haya indicado el mdico. No deje de tomar el antibitico aunque comience a sentirse mejor.  Revise todos los medicamentos con Health visitor y actuales para comprobar la posible transferencia a la [place]. Actividad  Retome sus actividades normales de a poco segn lo indicado por el mdico.  Descanse todo lo que pueda. Tome siestas mientras el beb duerme. Comida y bebida  Beba suficiente lquido como para Theatre manager la orina de color amarillo plido.  Para ayudar a prevenir o Cytogeneticist, coma alimentos ricos en [employer].  Elija una alimentacin saludable para ayudar a la lactancia o los objetivos de prdida de [place].  Tome sus vitaminas prenatales hasta que su mdico le indique que deje de [place].   Recomendaciones/consejos generales  No consuma ningn producto que contenga nicotina o tabaco. Estos productos incluyen cigarrillos, tabaco para Higher education careers adviser y aparatos de vapeo, como los Psychologist, sport and exercise. Si necesita ayuda para dejar de fumar, consulte al mdico.  No beba alcohol, especialmente si est amamantando.  No tome medicamentos o frmacos que no se le receten, especialmente si est en perodo de lactancia.  Visite al mdico para un control de posparto dentro de las primeras 3 a 6 semanas despus del [place].  Realice una visita posparto integral a ms tardar 12 semanas despus del parto.  Asista a todas las visitas de seguimiento para usted y su beb. Comunquese con un mdico si:  Siente tristeza o preocupacin de forma [place] mamas se ponen rojas, le duelen o se  endurecen.  Tiene fiebre u otros signos de infeccin.  Tiene sangrado que est empapando una compresa por hora o tiene cogulos de [place].  Siente un dolor de cabeza intenso que no se [place] o tiene cambios en la visin.  Tiene nuseas y vmitos y no puede comer o beber nada durante 24horas. Solicite ayuda de inmediato si:  Tiene dolor en el pecho o dificultad para respirar.  Tiene un dolor repentino e intenso en la pierna.  Tiene una convulsin o se desmaya.  Tiene pensamientos acerca de lastimarse a usted misma o a su beb. Si alguna vez siente que puede lastimarse o Market researcher a Producer, television/film/video, o tiene pensamientos de poner fin a su vida, busque ayuda de inmediato. Dirjase al servicio de urgencias ms cercano o:  Comunquese con el servicio de emergencias de su localidad (911 en los [place]).  National Suicide Prevention Lifeline ([place]) al 475-457-2147. Esta lnea de asistencia al suicida est abierta las 24 horas del da.  Enve un mensaje de texto a la lnea para casos de crisis al [phone] (en los EE.UU.). Resumen  El perodo de tiempo despus el parto y Waylan Boga 6 a 12 semanas despus  del parto se denomina perodo posparto.  Asista a todas las visitas de seguimiento para usted y su beb.  Revise todos los medicamentos con Health visitor y actuales para comprobar la posible transferencia a la [place].  Pngase en contacto con un mdico si se siente inusualmente triste o preocupada durante el perodo posparto. Esta informacin no tiene Marine scientist el consejo del mdico. Asegrese de hacerle al mdico cualquier pregunta que tenga. Document Revised: 08/09/2020 Document Reviewed: 07/17/2020 Elsevier Patient Education  2021 [employer].

## 2021-01-22 NOTE — Progress Notes (Signed)
[place] Partum Visit Note  Samantha Conner is a 35 y.o. [id number] female who presents for a postpartum visit. She is 6 weeks postpartum following a primary cesarean section.  I have fully reviewed the prenatal and intrapartum course. The delivery was at 38.6 gestational weeks.  Anesthesia: spinal. Postpartum course has been uncomplicated. Baby is doing well. Baby is feeding by breast. Bleeding clots and light bleeding, on and off . Bowel function is abnormal: constipation. Bladder function is normal. Patient is not sexually active. Contraception method is none. Postpartum depression screening: negative.   The pregnancy intention screening data noted above was reviewed. Potential methods of contraception were discussed. The patient elected to proceed with Hormonal Implant at [facility].    Edinburgh Postnatal Depression Scale - 01/22/21 0908      Edinburgh Postnatal Depression Scale:  In the Past 7 Days   I have been able to laugh and see the funny side of things. 1    I have looked forward with enjoyment to things. 1    I have blamed myself unnecessarily when things went wrong. 0    I have been anxious or worried for no good reason. 0    I have felt scared or panicky for no good reason. 0    Things have been getting on top of me. 1    I have been so unhappy that I have had difficulty sleeping. 0    I have felt sad or miserable. 0    I have been so unhappy that I have been crying. 0    The thought of harming myself has occurred to me. 0    Edinburgh Postnatal Depression Scale Total 3            The following portions of the patient's history were reviewed and updated as appropriate: allergies, current medications, past family history, past medical history, past social history, past surgical history and problem list.  Review of Systems Pertinent items noted in HPI and remainder of comprehensive ROS otherwise negative.    Objective:  BP 110/88   Pulse 79   Wt 127 lb (57.6 kg)   LMP  03/09/2020 (LMP Unknown)   BMI 25.22 kg/m    General:  alert, cooperative and appears stated age  Lungs: comfortable on room air  Abdomen: mildly but appropriately tender fundus well below umbilicus, cesarean scar well healed        Assessment:    Normal postpartum exam. Pap smear not done at today's visit.   Plan:   Essential components of care per ACOG recommendations:  1.  Mood and well being: Patient with negative depression screening today. Reviewed local resources for support.  - Patient does not use tobacco.  - hx of drug use? No    2. Infant care and feeding:  -Patient currently breastmilk feeding? Yes If breastmilk feeding discussed return to work and pumping. If needed, patient was provided letter for work to allow for every 2-3 hr pumping breaks, and to be granted a private location to express breastmilk and refrigerated area to store breastmilk. Reviewed importance of draining breast regularly to support lactation. -Social determinants of health (SDOH) reviewed in EPIC. The following needs were identified: food insecurity  3. Sexuality, contraception and birth spacing - Patient does not want a pregnancy in the next year.  Desired family size is unsure - Reviewed forms of contraception in tiered fashion. Patient desired Nexplanon at [facility] due to insurance issues today.   - Discussed  birth spacing of 18 months  4. Sleep and fatigue -Encouraged family/partner/community support of 4 hrs of uninterrupted sleep to help with mood and fatigue  5. Physical Recovery  - Discussed patients delivery and complications - Patient had a pLTCS for AODescent, scar well healed - Patient has urinary incontinence? No - Patient is safe to resume physical and sexual activity  6.  Health Maintenance - Last pap smear done 06/11/2020 and was normal  - Mammogram: n/a  7. Chronic Disease - 2hr GTT today. Discussed increased lifetime risk for DM2 and need for regular screening beyond  postpartum period.   Clarnce Flock, MD [facility] [place], [place]

## 2021-01-23 LAB — GLUCOSE TOLERANCE, 2 HOURS
Glucose, 2 hour: 107 mg/dL (ref 65–139)
Glucose, GTT - Fasting: 80 mg/dL (ref 65–99)

## 2021-04-29 ENCOUNTER — Other Ambulatory Visit: Payer: Self-pay

## 2021-04-29 ENCOUNTER — Emergency Department (HOSPITAL_COMMUNITY)
Admission: EM | Admit: 2021-04-29 | Discharge: 2021-04-29 | Disposition: A | Discharge status: Home / Self Care | Payer: Self-pay | Attending: Emergency Medicine | Admitting: Emergency Medicine

## 2021-04-29 ENCOUNTER — Encounter (HOSPITAL_COMMUNITY): Payer: Self-pay | Admitting: Emergency Medicine

## 2021-04-29 ENCOUNTER — Emergency Department (HOSPITAL_COMMUNITY): Payer: Self-pay

## 2021-04-29 DIAGNOSIS — R079 Chest pain, unspecified: Secondary | ICD-10-CM | POA: Insufficient documentation

## 2021-04-29 DIAGNOSIS — R1013 Epigastric pain: Secondary | ICD-10-CM | POA: Insufficient documentation

## 2021-04-29 DIAGNOSIS — M25511 Pain in right shoulder: Secondary | ICD-10-CM | POA: Insufficient documentation

## 2021-04-29 DIAGNOSIS — M25512 Pain in left shoulder: Secondary | ICD-10-CM | POA: Insufficient documentation

## 2021-04-29 LAB — URINALYSIS, ROUTINE W REFLEX MICROSCOPIC
Bilirubin Urine: NEGATIVE
Glucose, UA: NEGATIVE mg/dL
Hgb urine dipstick: NEGATIVE
Ketones, ur: NEGATIVE mg/dL
Leukocytes,Ua: NEGATIVE
Nitrite: NEGATIVE
Protein, ur: NEGATIVE mg/dL
Specific Gravity, Urine: 1.014 (ref 1.005–1.030)
pH: 5 (ref 5.0–8.0)

## 2021-04-29 LAB — CBC
HCT: 42.4 % (ref 36.0–46.0)
Hemoglobin: 13.5 g/dL (ref 12.0–15.0)
MCH: 26.8 pg (ref 26.0–34.0)
MCHC: 31.8 g/dL (ref 30.0–36.0)
MCV: 84.3 fL (ref 80.0–100.0)
Platelets: 194 10*3/uL (ref 150–400)
RBC: 5.03 MIL/uL (ref 3.87–5.11)
RDW: 15 % (ref 11.5–15.5)
WBC: 8.3 10*3/uL (ref 4.0–10.5)
nRBC: 0 % (ref 0.0–0.2)

## 2021-04-29 LAB — COMPREHENSIVE METABOLIC PANEL
ALT: 40 U/L (ref 0–44)
AST: 47 U/L — ABNORMAL HIGH (ref 15–41)
Albumin: 4.8 g/dL (ref 3.5–5.0)
Alkaline Phosphatase: 87 U/L (ref 38–126)
Anion gap: 11 (ref 5–15)
BUN: 22 mg/dL — ABNORMAL HIGH (ref 6–20)
CO2: 25 mmol/L (ref 22–32)
Calcium: 10.7 mg/dL — ABNORMAL HIGH (ref 8.9–10.3)
Chloride: 103 mmol/L (ref 98–111)
Creatinine, Ser: 0.67 mg/dL (ref 0.44–1.00)
GFR, Estimated: >60 mL/min (ref >60)
Glucose, Bld: 126 mg/dL — ABNORMAL HIGH (ref 70–99)
Potassium: 3.5 mmol/L (ref 3.5–5.1)
Sodium: 139 mmol/L (ref 135–145)
Total Bilirubin: 1.1 mg/dL (ref 0.3–1.2)
Total Protein: 8.3 g/dL — ABNORMAL HIGH (ref 6.5–8.1)

## 2021-04-29 LAB — LIPASE, BLOOD: Lipase: 42 U/L (ref 11–51)

## 2021-04-29 LAB — I-STAT BETA HCG BLOOD, ED (MC, WL, AP ONLY): I-stat hCG, quantitative: <5 m[IU]/mL (ref <5)

## 2021-04-29 MED ORDER — OMEPRAZOLE 20 MG PO CPDR: 20.0000 mg | DELAYED_RELEASE_CAPSULE | Freq: Every day | ORAL | 1 refills | Status: DC

## 2021-04-29 MED ORDER — SUCRALFATE 1 G PO TABS: 1.0000 g | ORAL_TABLET | Freq: Four times a day (QID) | ORAL | 0 refills | Status: DC | PRN

## 2021-04-29 NOTE — Discharge Instructions (Addendum)
You were evaluated in the Emergency Department and after careful evaluation, we did not find any emergent condition requiring admission or further testing in the hospital.  Your exam/testing today is overall reassuring.  Symptoms likely due to acid reflux.  Please take the omeprazole medication daily to prevent pain.  You can also take the Carafate medication up to 4 times daily for immediate relief.  Please return to the Emergency Department if you experience any worsening of your condition.   Thank you for allowing [place] to be a part of your care.

## 2021-04-29 NOTE — ED Triage Notes (Signed)
Patient is spanish speaking. Samantha Conner [phone] interpreter. Patient is complaining of upper stomach pain, neck pain, and shoulders that began about a few hours ago.

## 2021-04-29 NOTE — ED Provider Notes (Signed)
[facility] Emergency Department Provider Note MRN:  [id number]  Arrival date & time: 04/29/21     Chief Complaint   Abdominal Pain, Neck Pain (Shoulder pain/), and Shoulder Pain   History of Present Illness   Samantha Conner is a 35 y.o. year-old female with no pertinent past medical history presenting to the ED with chief complaint of chest pain.  Pain in the center of the chest described as burning, also with pain to bilateral shoulder blades.  Occurred shortly after eating a meal of spicy barbecue.  Was constant, moderate in severity but has since resolved.  Was also having some pain in the epigastrium at the time, denies any lower abdominal pain, no vaginal bleeding or discharge, no nausea vomiting or diarrhea, no other complaints.  No shortness of breath, no leg pain or swelling, no history of blood clots, no birth control pills.  Review of Systems  A complete 10 system review of systems was obtained and all systems are negative except as noted in the HPI and PMH.   Patient's Health History    Past Medical History:  Diagnosis Date   Fibroids    Gestational diabetes    PCOS (polycystic ovarian syndrome)     Past Surgical History:  Procedure Laterality Date   CESAREAN SECTION  12/06/2020   Procedure: CESAREAN SECTION;  Surgeon: Donnamae Jude, MD;  Location: [facility] LD ORS;  Service: Obstetrics;;   NO PAST SURGERIES      Family History  Problem Relation Age of Onset   Diabetes Neg Hx    Hypertension Neg Hx     Social History   Socioeconomic History   Marital status: Married    Spouse name: Not on file   Number of children: Not on file   Years of education: Not on file   Highest education level: Not on file  Occupational History   Not on file  Tobacco Use   Smoking status: Never   Smokeless tobacco: Never  Vaping Use   Vaping Use: Never used  Substance and Sexual Activity   Alcohol use: Never   Drug use: Never   Sexual activity: Not  Currently    Birth control/protection: None  Other Topics Concern   Not on file  Social History Narrative   Not on file   Social Determinants of Health   Financial Resource Strain: Not on file  Food Insecurity: Food Insecurity Present   Worried About [place] in the Last Year: Sometimes true   Ran Out of Food in the Last Year: Sometimes true  Transportation Needs: No Transportation Needs   Lack of Transportation (Medical): No   Lack of Transportation (Non-Medical): No  Physical Activity: Not on file  Stress: Not on file  Social Connections: Not on file  Intimate Partner Violence: Not on file     Physical Exam   Vitals:   04/29/21 0539 04/29/21 0540  BP: 131/84   Pulse: 79 79  Resp: 20 20  Temp:    SpO2: 100% 100%    CONSTITUTIONAL: Well-appearing, NAD NEURO:  Alert and oriented x 3, no focal deficits EYES:  eyes equal and reactive ENT/NECK:  no LAD, no JVD CARDIO: Regular rate, well-perfused, normal S1 and S2 PULM:  CTAB no wheezing or rhonchi GI/GU:  normal bowel sounds, non-distended, non-tender MSK/SPINE:  No gross deformities, no edema SKIN:  no rash, atraumatic PSYCH:  Appropriate speech and behavior  *Additional and/or pertinent findings included in MDM below  Diagnostic and Interventional Summary    EKG Interpretation  Date/Time:  Monday April 29 2021 05:38:26 EDT Ventricular Rate:  80 PR Interval:  141 QRS Duration: 120 QT Interval:  366 QTC Calculation: 423 R Axis:   30 Text Interpretation: Sinus rhythm IVCD, consider atypical RBBB Baseline wander in lead(s) I No previous ECGs available Confirmed by Gerlene Fee [phone]) on 04/29/2021 5:52:58 AM        Labs Reviewed  COMPREHENSIVE METABOLIC PANEL - Abnormal; Notable for the following components:      Result Value   Glucose, Bld 126 (*)    BUN 22 (*)    Calcium 10.7 (*)    Total Protein 8.3 (*)    AST 47 (*)    All other components within normal limits  URINALYSIS, ROUTINE W  REFLEX MICROSCOPIC - Abnormal; Notable for the following components:   Color, Urine STRAW (*)    All other components within normal limits  LIPASE, BLOOD  CBC  I-STAT BETA HCG BLOOD, ED ([facility], [facility], [facility] ONLY)    DG Chest 2 View  Final Result      Medications - No data to display   Procedures  /  Critical Care Procedures  ED Course and Medical Decision Making  I have reviewed the triage vital signs, the nursing notes, and pertinent available records from the EMR.  Listed above are laboratory and imaging tests that I personally ordered, reviewed, and interpreted and then considered in my medical decision making (see below for details).  Favoring GI etiology given the onset after food, burning nature.  EKG with nonspecific conduction delay.  Morphology not consistent with [place].  No syncopal events.  Awaiting labs, x-ray.     Labs reassuring, x-ray normal, no enlargement of mediastinum, highly doubt dissection or cardiac etiology.  Patient is made aware of the conduction delay found on EKG and will refer to cardiology.  Otherwise appropriate for discharge with symptomatic management of likely GERD.  Barth Kirks. Sedonia Small, MD [place] [email or web address]  Final Clinical Impressions(s) / ED Diagnoses     ICD-10-CM   1. Chest pain, unspecified type  R07.9       ED Discharge Orders          Ordered    sucralfate (CARAFATE) 1 g tablet  4 times daily PRN        04/29/21 0623    omeprazole (PRILOSEC) 20 MG capsule  Daily        04/29/21 2800             Discharge Instructions Discussed with and Provided to Patient:     Discharge Instructions      You were evaluated in the Emergency Department and after careful evaluation, we did not find any emergent condition requiring admission or further testing in the hospital.  Your exam/testing today is overall reassuring.  Symptoms likely due to acid reflux.  Please take the omeprazole  medication daily to prevent pain.  You can also take the Carafate medication up to 4 times daily for immediate relief.  Please return to the Emergency Department if you experience any worsening of your condition.   Thank you for allowing [place] to be a part of your care.        Maudie Flakes, MD 04/29/21 [phone]

## 2021-07-09 ENCOUNTER — Telehealth: Payer: Self-pay

## 2021-07-09 NOTE — Telephone Encounter (Signed)
Since her C-section 7 months ago, pt has experienced abdominal/stomach pain, neck pain, back pain, and has trouble breathing during every menstrual cycle. Has an appointment to establish care in December. Would like any recommendations until then

## 2021-07-11 NOTE — Telephone Encounter (Signed)
Until she is evaluated, it is very hard to know what to suggest.   Put her on wait list to see if can get in sooner.

## 2021-07-18 NOTE — Progress Notes (Signed)
Cardiology Office Note:   Date:  07/19/2021  NAME:  Samantha Conner    MRN: [id number] DOB:  1985/11/02   PCP:  Patient, No Pcp Per (Inactive)  Cardiologist:  None  Electrophysiologist:  None   Referring MD: Maudie Flakes, MD   Chief Complaint  Patient presents with   Abnormal ECG   History of Present Illness:   Samantha Conner is a 35 y.o. female with a hx of GERD, PCOS who is being seen today for the evaluation of abnormal EKG at the request of Maudie Flakes, MD. Seen in the ER 04/29/2021 for GERD symptoms. EKG showed iRBBB which is normal finding in a young adult. She was seen in the emergency room for chest pain symptoms.  She describes symptoms of abdominal pain that go into her back as well as in to her chest.  This occurs with menstruation.  I have encouraged her to take ibuprofen as this is likely menstrual symptoms.  Her EKG in office demonstrates normal sinus rhythm with an incomplete right bundle branch block.  This is a normal finding in a healthy young adult.  She has no history of cardiovascular disease.  BP 113/79.  Nobody in the family has heart disease.  She reports mom and dad are still living.  No concerns for sudden death.  She had gestational diabetes.  No history of diabetes currently.  She has not followed with her primary care physician.  She does not have 1.  I have encouraged her to follow with community health and wellness.  She reports she exercises without limitations.  No syncope or passing out spells.  She overall denies any symptoms in office today.  Past Medical History: Past Medical History:  Diagnosis Date   Fibroids    Gestational diabetes    PCOS (polycystic ovarian syndrome)     Past Surgical History: Past Surgical History:  Procedure Laterality Date   CESAREAN SECTION  12/06/2020   Procedure: CESAREAN SECTION;  Surgeon: Donnamae Jude, MD;  Location: [facility];  Service: Obstetrics;;   NO PAST SURGERIES      Current Medications: Current  Meds  Medication Sig   acetaminophen (TYLENOL) 500 MG tablet Take 2 tablets (1,000 mg total) by mouth every 6 (six) hours as needed.   coconut oil OIL Apply 1 application topically as needed.   ferrous sulfate 325 (65 FE) MG tablet Take 1 tablet (325 mg total) by mouth every other day.   ibuprofen (ADVIL) 600 MG tablet Take 1 tablet (600 mg total) by mouth every 6 (six) hours as needed for fever or headache.   omeprazole (PRILOSEC) 20 MG capsule Take 1 capsule (20 mg total) by mouth daily.   oxyCODONE (OXY IR/ROXICODONE) 5 MG immediate release tablet Take 1 tablet (5 mg total) by mouth every 6 (six) hours as needed for moderate pain.   Prenatal Vit-DSS-Fe Fum-FA (PRENATAL 19) tablet Take 1 tablet by mouth daily.   sucralfate (CARAFATE) 1 g tablet Take 1 tablet (1 g total) by mouth 4 (four) times daily as needed.     Allergies:    Patient has no known allergies.   Social History: Social History   Socioeconomic History   Marital status: Married    Spouse name: Not on file   Number of children: 1   Years of education: Not on file   Highest education level: Not on file  Occupational History   Not on file  Tobacco Use  Smoking status: Never   Smokeless tobacco: Never  Vaping Use   Vaping Use: Never used  Substance and Sexual Activity   Alcohol use: Never   Drug use: Never   Sexual activity: Not Currently    Birth control/protection: None  Other Topics Concern   Not on file  Social History Narrative   Not on file   Social Determinants of Health   Financial Resource Strain: Not on file  Food Insecurity: Food Insecurity Present   Worried About [place] in the Last Year: Sometimes true   [place] in the Last Year: Sometimes true  Transportation Needs: No Transportation Needs   Lack of Transportation (Medical): No   Lack of Transportation (Non-Medical): No  Physical Activity: Not on file  Stress: Not on file  Social Connections: Not on file     Family  History: The patient's family history includes Diabetes in her mother. There is no history of Hypertension.  ROS:   All other ROS reviewed and negative. Pertinent positives noted in the HPI.     EKGs/Labs/Other Studies Reviewed:   The following studies were personally reviewed by me today:  EKG:  EKG is ordered today.  The ekg ordered today demonstrates normal sinus rhythm, heart rate 64, incomplete right bundle branch block, normal EKG in a young healthy adult, and was personally reviewed by me.   Recent Labs: 04/29/2021: ALT 40; BUN 22; Creatinine, Ser 0.67; Hemoglobin 13.5; Platelets 194; Potassium 3.5; Sodium 139   Recent Lipid Panel No results found for: CHOL, TRIG, HDL, CHOLHDL, VLDL, LDLCALC, LDLDIRECT  Physical Exam:   VS:  BP 113/79   Pulse 64   Ht 5' (1.524 m)   Wt 130 lb (59 kg)   SpO2 100%   BMI 25.39 kg/m    Wt Readings from Last 3 Encounters:  07/19/21 130 lb (59 kg)  04/29/21 116 lb 13.5 oz (53 kg)  01/22/21 127 lb (57.6 kg)    General: Well nourished, well developed, in no acute distress Head: Atraumatic, normal size  Eyes: PEERLA, EOMI  Neck: Supple, no JVD Endocrine: No thryomegaly Cardiac: Normal S1, S2; RRR; no murmurs, rubs, or gallops Lungs: Clear to auscultation bilaterally, no wheezing, rhonchi or rales  Abd: Soft, nontender, no hepatomegaly  Ext: No edema, pulses 2+ Musculoskeletal: No deformities, BUE and BLE strength normal and equal Skin: Warm and dry, no rashes   Neuro: Alert and oriented to person, place, time, and situation, CNII-XII grossly intact, no focal deficits  Psych: Normal mood and affect   ASSESSMENT:   Samantha Conner is a 35 y.o. female who presents for the following: 1. Incomplete RBBB   2. Gastroesophageal reflux disease without esophagitis     PLAN:   1. Incomplete RBBB -Seen in the emergency room in July for chest pain symptoms.  These appear to be related to menstruation.  I encouraged her to take ibuprofen.  EKG at  that time demonstrated an incomplete right bundle branch block.  She has no acute ischemic changes or evidence of infarction.  She has nonspecific ST-T changes which are likely obesity related.  There is no evidence of Brugada or any other channel apathy that I can see on her EKG tracing.  Her QTC intervals are normal.  She describes no alarming symptoms such as syncope and there is no family history of heart disease or sudden death.  Overall her EKG is very normal.  Her cardiovascular examination is normal.  This  does not need further work-up.  She will see [place] as needed.  2. Gastroesophageal reflux disease without esophagitis -She describes a constellation of abdominal pain and back pain with menstruation.  Prilosec does not help.  I believe her symptoms are related to menstruation.  I have encouraged her to take ibuprofen.  We will also give her information for community health and wellness.  She needs yearly gyn exams.   Disposition: Return if symptoms worsen or fail to improve.  Medication Adjustments/Labs and Tests Ordered: Current medicines are reviewed at length with the patient today.  Concerns regarding medicines are outlined above.  Orders Placed This Encounter  Procedures   EKG 12-Lead    No orders of the defined types were placed in this encounter.   Patient Instructions  Medication Instructions:  The current medical regimen is effective;  continue present plan and medications.  *If you need a refill on your cardiac medications before your next appointment, please call your pharmacy*   Follow-Up: At [facility], you and your health needs are our priority.  As part of our continuing mission to provide you with exceptional heart care, we have created designated Provider Care Teams.  These Care Teams include your primary Cardiologist (physician) and Advanced Practice Providers (APPs -  Physician Assistants and Nurse Practitioners) who all work together to provide you with the  care you need, when you need it.  We recommend signing up for the patient portal called "MyChart".  Sign up information is provided on this After Visit Summary.  MyChart is used to connect with patients for Virtual Visits (Telemedicine).  Patients are able to view lab/test results, encounter notes, upcoming appointments, etc.  Non-urgent messages can be sent to your provider as well.   To learn more about what you can do with MyChart, go to [email or web address].    Your next appointment:   As needed  The format for your next appointment:   In Person  Provider:   Eleonore Chiquito, MD   Other Instructions [facility] health and [facility] [place], [place] [address] [phone]   Signed, Addison Naegeli. Audie Box, MD, [place]  [address] [place], [place] [address] [phone]  07/19/2021 10:07 AM

## 2021-07-19 ENCOUNTER — Ambulatory Visit (INDEPENDENT_AMBULATORY_CARE_PROVIDER_SITE_OTHER): Payer: Self-pay | Admitting: Cardiovascular Disease

## 2021-07-19 ENCOUNTER — Other Ambulatory Visit: Payer: Self-pay

## 2021-07-19 ENCOUNTER — Encounter (HOSPITAL_BASED_OUTPATIENT_CLINIC_OR_DEPARTMENT_OTHER): Payer: Self-pay | Admitting: Cardiovascular Disease

## 2021-07-19 VITALS — BP 113/79 | HR 64 | Ht 60.0 in | Wt 130.0 lb

## 2021-07-19 DIAGNOSIS — I4510 Unspecified right bundle-branch block: Secondary | ICD-10-CM

## 2021-07-19 DIAGNOSIS — K219 Gastro-esophageal reflux disease without esophagitis: Secondary | ICD-10-CM

## 2021-07-19 NOTE — Patient Instructions (Signed)
Medication Instructions:  The current medical regimen is effective;  continue present plan and medications.  *If you need a refill on your cardiac medications before your next appointment, please call your pharmacy*   Follow-Up: At [facility], you and your health needs are our priority.  As part of our continuing mission to provide you with exceptional heart care, we have created designated Provider Care Teams.  These Care Teams include your primary Cardiologist (physician) and Advanced Practice Providers (APPs -  Physician Assistants and Nurse Practitioners) who all work together to provide you with the care you need, when you need it.  We recommend signing up for the patient portal called "MyChart".  Sign up information is provided on this After Visit Summary.  MyChart is used to connect with patients for Virtual Visits (Telemedicine).  Patients are able to view lab/test results, encounter notes, upcoming appointments, etc.  Non-urgent messages can be sent to your provider as well.   To learn more about what you can do with MyChart, go to [email or web address].    Your next appointment:   As needed  The format for your next appointment:   In Person  Provider:   Eleonore Chiquito, MD   Other Instructions [facility] health and [facility] [address] [place], [place] [address] [phone]

## 2021-09-25 ENCOUNTER — Ambulatory Visit: Payer: Self-pay | Admitting: Physician Assistant

## 2021-10-08 ENCOUNTER — Ambulatory Visit: Payer: Self-pay | Admitting: Internal Medicine

## 2021-11-12 ENCOUNTER — Ambulatory Visit: Payer: Self-pay | Admitting: Critical Care Medicine

## 2021-12-03 ENCOUNTER — Ambulatory Visit: Payer: Self-pay | Admitting: Internal Medicine

## 2021-12-03 ENCOUNTER — Encounter: Payer: Self-pay | Admitting: Internal Medicine

## 2021-12-03 ENCOUNTER — Other Ambulatory Visit: Payer: Self-pay

## 2021-12-03 VITALS — BP 116/86 | HR 76 | Resp 16 | Ht 59.75 in | Wt 133.0 lb

## 2021-12-03 DIAGNOSIS — Z23 Encounter for immunization: Secondary | ICD-10-CM

## 2021-12-03 DIAGNOSIS — Z5941 Food insecurity: Secondary | ICD-10-CM

## 2021-12-03 DIAGNOSIS — D696 Thrombocytopenia, unspecified: Secondary | ICD-10-CM

## 2021-12-03 DIAGNOSIS — O99119 Other diseases of the blood and blood-forming organs and certain disorders involving the immune mechanism complicating pregnancy, unspecified trimester: Secondary | ICD-10-CM

## 2021-12-03 DIAGNOSIS — Z7689 Persons encountering health services in other specified circumstances: Secondary | ICD-10-CM

## 2021-12-03 DIAGNOSIS — O24410 Gestational diabetes mellitus in pregnancy, diet controlled: Secondary | ICD-10-CM

## 2021-12-03 NOTE — Progress Notes (Signed)
° ° ° °  Subjective:    Patient ID: Samantha Conner, female   DOB: Jun 14, 1986, 36 y.o.   MRN: [id number]   HPI  Samantha Conner interprets   Menstrual issues:  Was postpartum when called to make appt.  Was having low back and abdominal pain with periods.  Has gradually resolved.  History of PCOS in chart.  Periods, however, are regular now.  She is a bit late this month.  LMP: 10/25/2021.  Uses condoms for birth control.  States they are consistent with this. Has performed some OTC urine HCG testing, which have been negative.    2.  Gestational Diabetes:  With pregnancy delivered by C/S in 11/2020.  Has not had follow up with blood sugars since her postpartum visit. Suffered SAB 6 weeks before pregnancy she carried to term in February.   Has not lost weight since the delivery.  Weighed 128 lbs at delivery.   Family history of DM.    3.  HM:  has had 2 covid vaccines.  States had [place].    Current Meds  Medication Sig   acetaminophen (TYLENOL) 500 MG tablet Take 2 tablets (1,000 mg total) by mouth every 6 (six) hours as needed.   ibuprofen (ADVIL) 600 MG tablet Take 1 tablet (600 mg total) by mouth every 6 (six) hours as needed for fever or headache.   Prenatal Vit-DSS-Fe Fum-FA (PRENATAL 19) tablet Take 1 tablet by mouth daily.   No Known Allergies Past Medical History:  Diagnosis Date   Fibroids    Gestational diabetes    PCOS (polycystic ovarian syndrome)     Past Surgical History:  Procedure Laterality Date   CESAREAN SECTION  12/06/2020   Procedure: CESAREAN SECTION;  Surgeon: Donnamae Jude, MD;  Location: [facility];  Service: Obstetrics;;   Family History  Problem Relation Age of Onset   Diabetes Mother    Hypertension Neg Hx      Review of Systems    Objective:   BP 116/86 (BP Location: Left Arm, Patient Position: Sitting, Cuff Size: Normal)    Pulse 76    Resp 16    Ht 4' 11.75" (1.518 m)    Wt 133 lb (60.3 kg)    LMP 10/25/2021 (Exact Date)    Breastfeeding  Yes    BMI 26.19 kg/m   Physical Exam NAD HEENT: PERRL, EOMI, TMs pearly gray, throat without injection Neck:  Supple, No adenopathy Chest:  CTA CV:  RRR with normal S1 and S2, No S3, S4 or murmur.  Carotid, radial, and DP pulses normal and equal Abd:  S, NT, No HSM or mass, + BS LE:  No edema.  Assessment & Plan    Dysmenorrhea:  Resolved.  Hx of PCOS, but does not seem to be an issue currently  2.  History of GDM:  A1C, nonfasting lipids, CMP.  3.  Gestational related Thrombocytopenia:  CBC.    4.  HM:  Moderna Bivalent booster.  5.  History of food insecurity:  referral to [facility], [place]

## 2021-12-04 LAB — CBC WITH DIFFERENTIAL/PLATELET
Basophils Absolute: 0 10*3/uL (ref 0.0–0.2)
Basos: 0 %
EOS (ABSOLUTE): 0.1 10*3/uL (ref 0.0–0.4)
Eos: 2 %
Hematocrit: 38.4 % (ref 34.0–46.6)
Hemoglobin: 12.7 g/dL (ref 11.1–15.9)
Immature Grans (Abs): 0 10*3/uL (ref 0.0–0.1)
Immature Granulocytes: 0 %
Lymphocytes Absolute: 1.8 10*3/uL (ref 0.7–3.1)
Lymphs: 30 %
MCH: 28.5 pg (ref 26.6–33.0)
MCHC: 33.1 g/dL (ref 31.5–35.7)
MCV: 86 fL (ref 79–97)
Monocytes Absolute: 0.4 10*3/uL (ref 0.1–0.9)
Monocytes: 7 %
Neutrophils Absolute: 3.6 10*3/uL (ref 1.4–7.0)
Neutrophils: 61 %
Platelets: 194 10*3/uL (ref 150–450)
RBC: 4.45 x10E6/uL (ref 3.77–5.28)
RDW: 13 % (ref 11.7–15.4)
WBC: 6 10*3/uL (ref 3.4–10.8)

## 2021-12-04 LAB — COMPREHENSIVE METABOLIC PANEL
ALT: 15 IU/L (ref 0–32)
AST: 12 IU/L (ref 0–40)
Albumin/Globulin Ratio: 2 (ref 1.2–2.2)
Albumin: 4.5 g/dL (ref 3.8–4.8)
Alkaline Phosphatase: 80 IU/L (ref 44–121)
BUN/Creatinine Ratio: 23 (ref 9–23)
BUN: 15 mg/dL (ref 6–20)
Bilirubin Total: 0.7 mg/dL (ref 0.0–1.2)
CO2: 23 mmol/L (ref 20–29)
Calcium: 9.1 mg/dL (ref 8.7–10.2)
Chloride: 105 mmol/L (ref 96–106)
Creatinine, Ser: 0.66 mg/dL (ref 0.57–1.00)
Globulin, Total: 2.2 g/dL (ref 1.5–4.5)
Glucose: 108 mg/dL — ABNORMAL HIGH (ref 70–99)
Potassium: 3.7 mmol/L (ref 3.5–5.2)
Sodium: 139 mmol/L (ref 134–144)
Total Protein: 6.7 g/dL (ref 6.0–8.5)
eGFR: 118 mL/min/{1.73_m2} (ref >59)

## 2021-12-04 LAB — LIPID PANEL W/O CHOL/HDL RATIO
Cholesterol, Total: 158 mg/dL (ref 100–199)
HDL: 44 mg/dL (ref >39)
LDL Chol Calc (NIH): 86 mg/dL (ref 0–99)
Triglycerides: 159 mg/dL — ABNORMAL HIGH (ref 0–149)
VLDL Cholesterol Cal: 28 mg/dL (ref 5–40)

## 2021-12-04 LAB — HGB A1C W/O EAG: Hgb A1c MFr Bld: 5.7 % — ABNORMAL HIGH (ref 4.8–5.6)

## 2021-12-15 IMAGING — US US MFM OB DETAIL+14 WK
1 series · 13 of 28 positions shown · non-contrast
Comparison: none

[Series 1: us mfm ob detail+14 wk · 50 acquisitions, 13 frames shown]
[im 2/50]
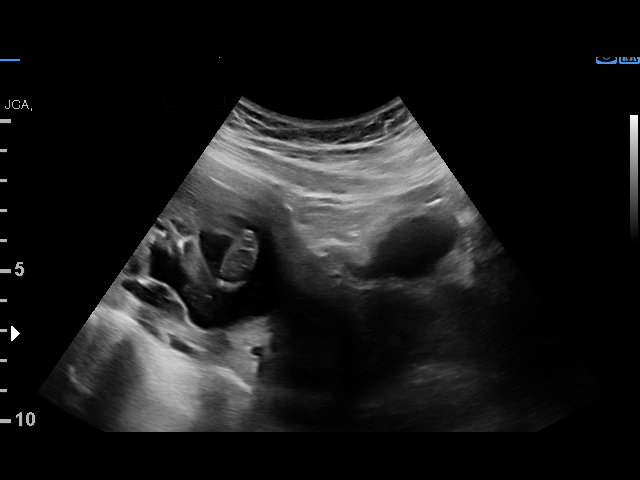
[im 6/50]
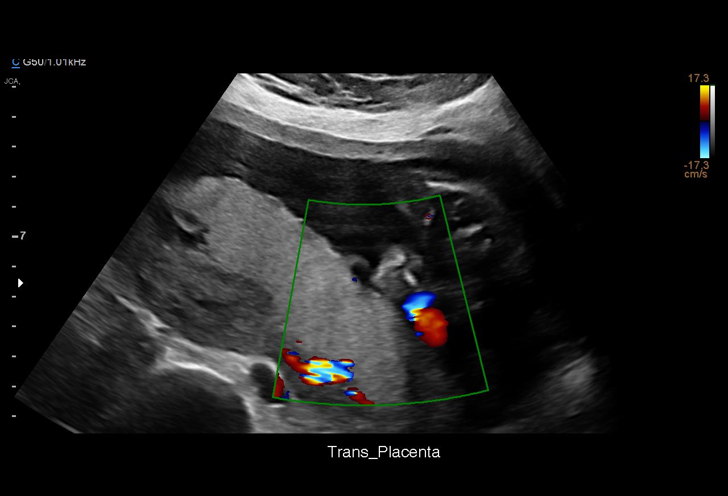
[im 10/50]
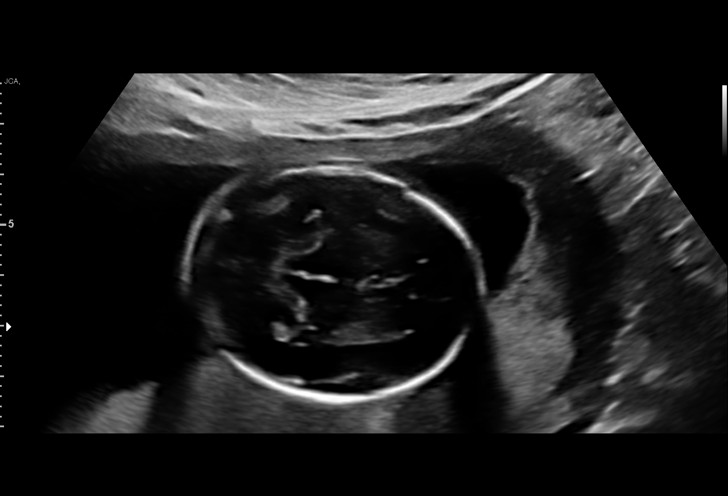
[im 13/50]
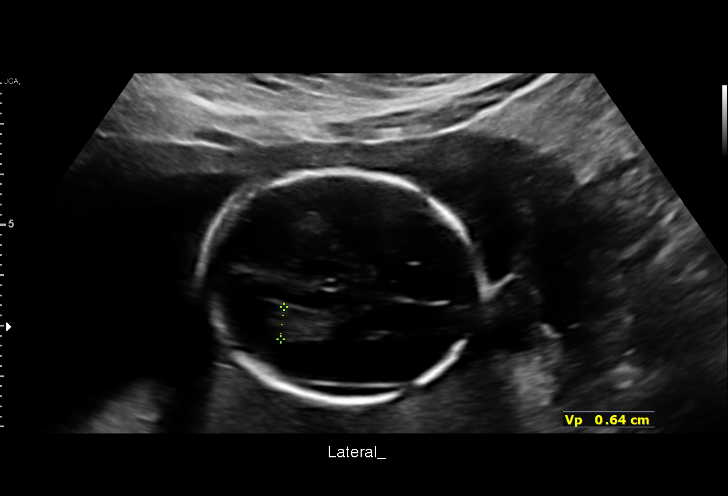
[im 17/50]
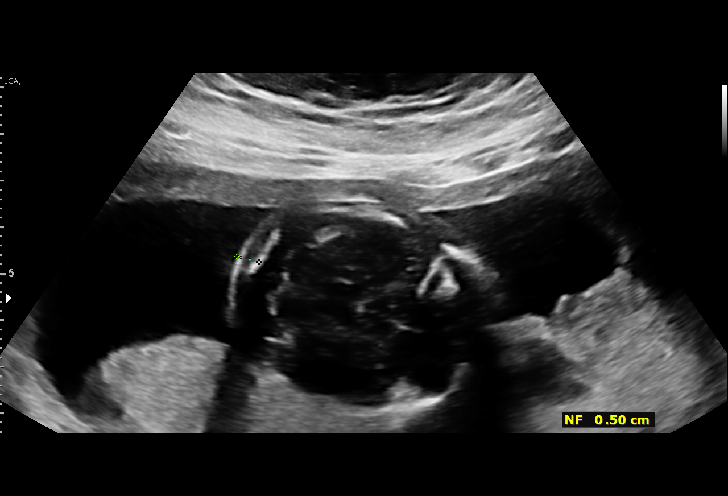
[im 20/50]
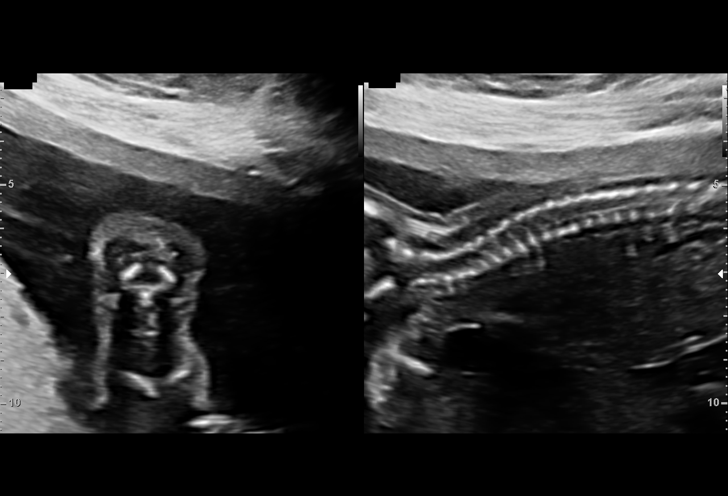
[im 26/50]
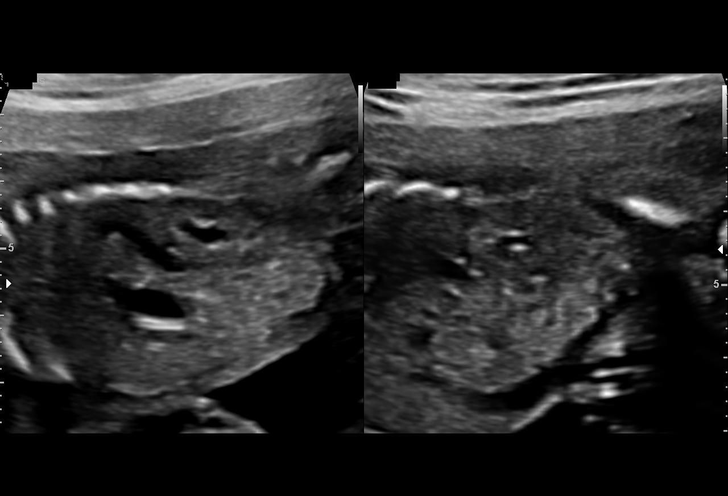
[im 30/50]
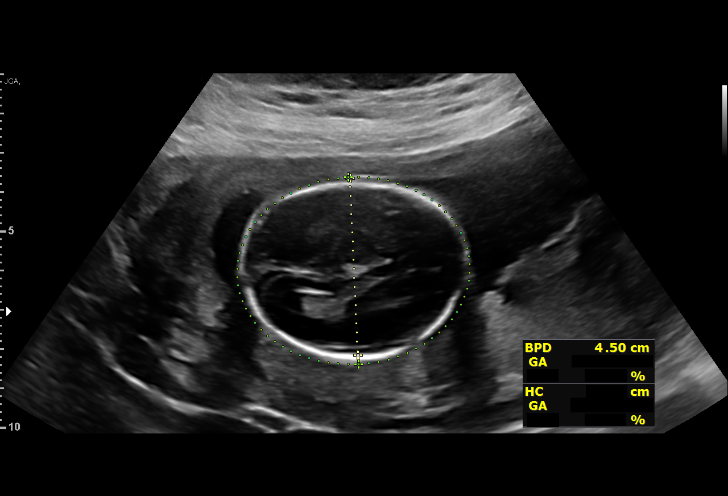
[im 33/50]
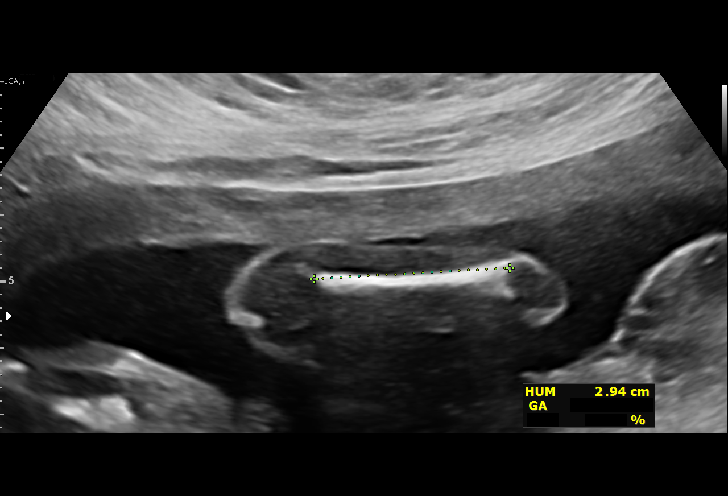
[im 37/50]
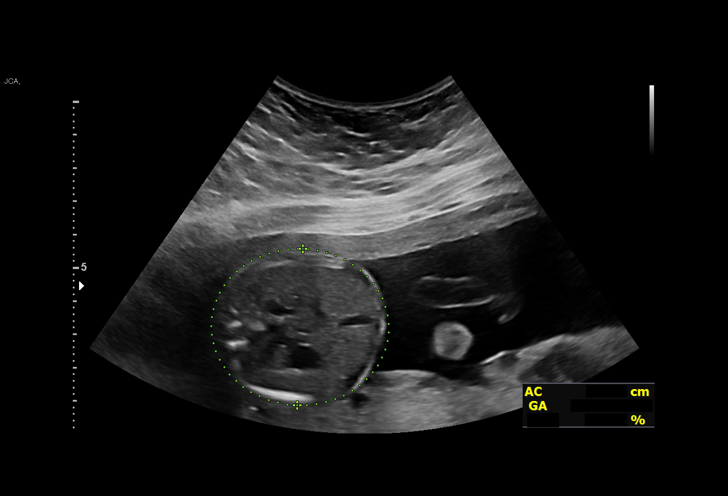
[im 40/50]
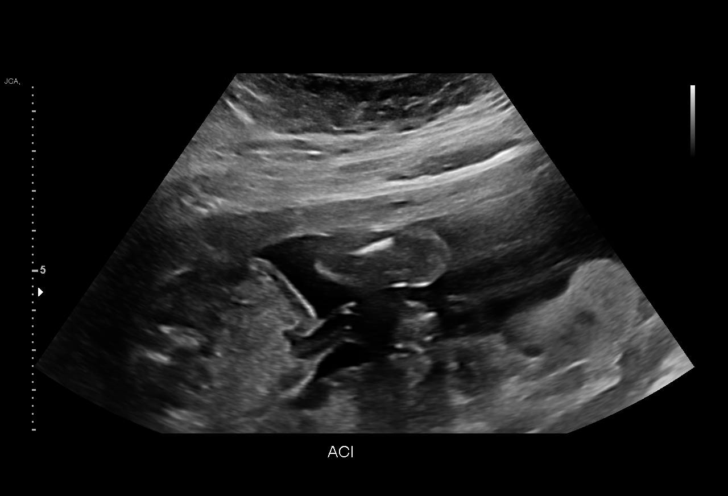
[im 44/50]
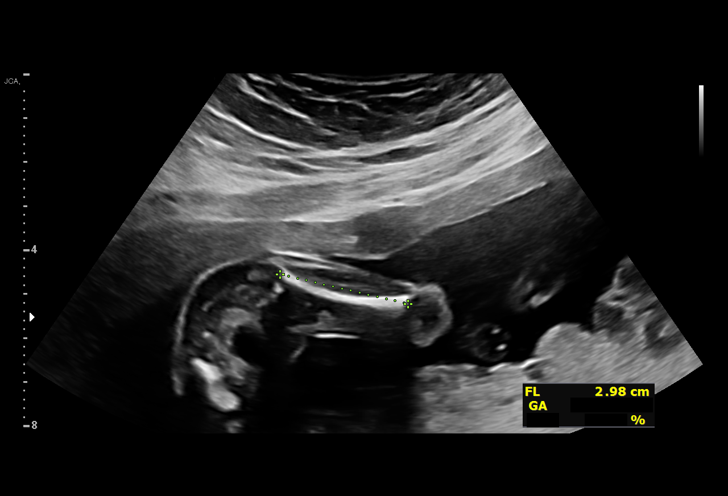
[im 48/50]
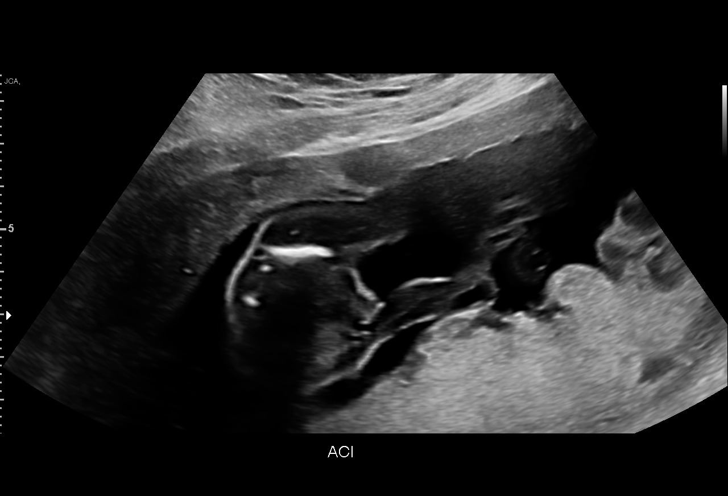

[13 of 28 positions shown; findings below may reference images not displayed]

Indications

 Gestational diabetes in pregnancy, diet
 controlled
 Uterine fibroids
 19 weeks gestation of pregnancy
 Encounter for antenatal screening for
 malformations
Fetal Evaluation

 Num Of Fetuses:         1
 Fetal Heart Rate(bpm):  141
 Cardiac Activity:       Observed
 Presentation:           Breech
 Placenta:               Posterior
 P. Cord Insertion:      Not well visualized

 Amniotic Fluid
 AFI FV:      Within normal limits

                             Largest Pocket(cm)

Biometry

 BPD:      45.2  mm     G. Age:  19w 5d         61  %    CI:        71.87   %    70 - 86
                                                         FL/HC:      17.9   %    16.1 -
 HC:      169.7  mm     G. Age:  19w 4d         50  %    HC/AC:      1.10        1.09 -
 AC:      154.2  mm     G. Age:  20w 4d         82  %    FL/BPD:     67.0   %
 FL:       30.3  mm     G. Age:  19w 3d         40  %    FL/AC:      19.6   %    20 - 24
 HUM:      30.2  mm     G. Age:  20w 0d         65  %
 CER:      20.1  mm     G. Age:  19w 3d         53  %
 NFT:       5.0  mm

 LV:        6.4  mm
 CM:        5.6  mm

 Est. FW:     325  gm    0 lb 11 oz      78  %
OB History

 Gravidity:    2         Term:   0        Prem:   0        SAB:   1
 Living:       0
Gestational Age

 LMP:           19w 3d        Date:  03/09/20                 EDD:   12/14/20
 U/S Today:     19w 6d                                        EDD:   12/11/20
 Best:          19w 3d     Det. By:  LMP  (03/09/20)          EDD:   12/14/20
Anatomy

 Cranium:               Appears normal         Aortic Arch:            Not well visualized
 Cavum:                 Appears normal         Ductal Arch:            Not well visualized
 Ventricles:            Appears normal         Diaphragm:              Appears normal
 Choroid Plexus:        Appears normal         Stomach:                Appears normal, left
                                                                       sided
 Cerebellum:            Appears normal         Abdomen:                Appears normal
 Posterior Fossa:       Appears normal         Abdominal Wall:         Appears nml (cord
                                                                       insert, abd wall)
 Nuchal Fold:           Appears normal         Cord Vessels:           Appears normal (3
                                                                       vessel cord)
 Face:                  Orbits nl; profile not Kidneys:                Appear normal
                        well visualized
 Lips:                  Not well visualized    Bladder:                Appears normal
 Thoracic:              Appears normal         Spine:                  Appears normal
 Heart:                 Not well visualized    Upper Extremities:      Appears normal
 RVOT:                  Not well visualized    Lower Extremities:      Appears normal
 LVOT:                  Not well visualized

 Other:  Normal genaitalia. Fetus appears to be a male. Technically difficult
         due to fetal position.
Cervix Uterus Adnexa

 Right Ovary
 Not visualized.

 Left Ovary
 Not visualized.
Impression

 G2 P0.  Patient is here for a she has an early diagnosis of
 gestational diabetes.  Recent hemoglobin LAcPSLWQ
 screening showed low risk for open-neural tube defects . was
 5.4% (normal).  We do not have information on aneuploidy
 screening.

 We performed fetal anatomy scan. No makers of
 aneuploidies or fetal structural defects are seen. Fetal
 biometry is consistent with her previously-established dates.
 Amniotic fluid is normal and good fetal activity is seen.
 Patient understands the limitations of ultrasound in detecting
 fetal anomalies.
 On transabdominal scan, the cervix could not be clearly
 measured but there was no funneling seen.
 I reassured the patient of the findings.  I discussed screening
 for aneuploidies.  Patient has an appointment this week and
 will be discussing screening for aneuploidies.
Recommendations

 -An appointment was made for her to return in 4 weeks for
 fetal growth assessment and completion of fetal anatomy.
 -Fetal growth assessment every 4 weeks.
                      Lollar, Astit

## 2022-02-17 IMAGING — US US MFM OB FOLLOW-UP
1 series · 14 of 28 positions shown · non-contrast
Comparison: none

[Series 1: us mfm ob follow-up · 14 of 31 slices shown]
[im 2/31]
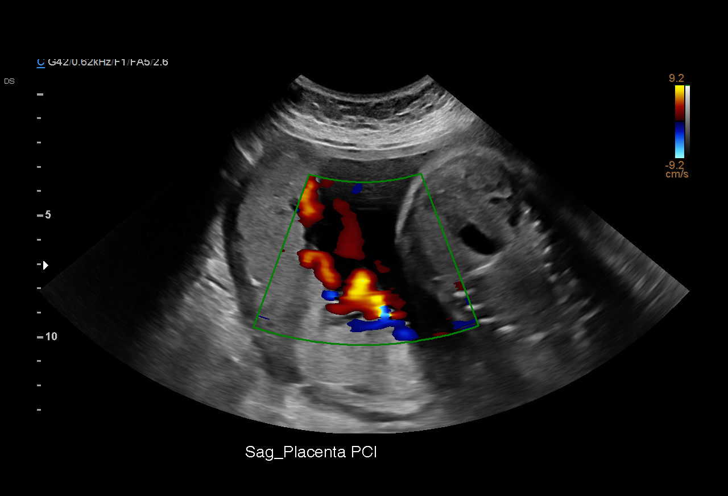
[im 4/31]
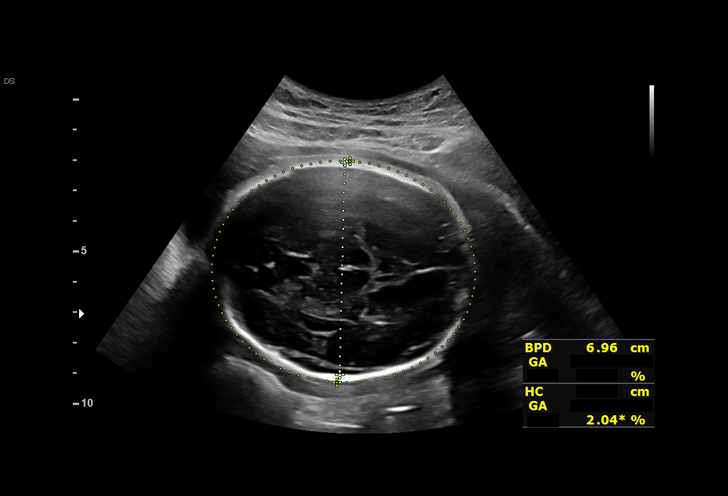
[im 6/31]
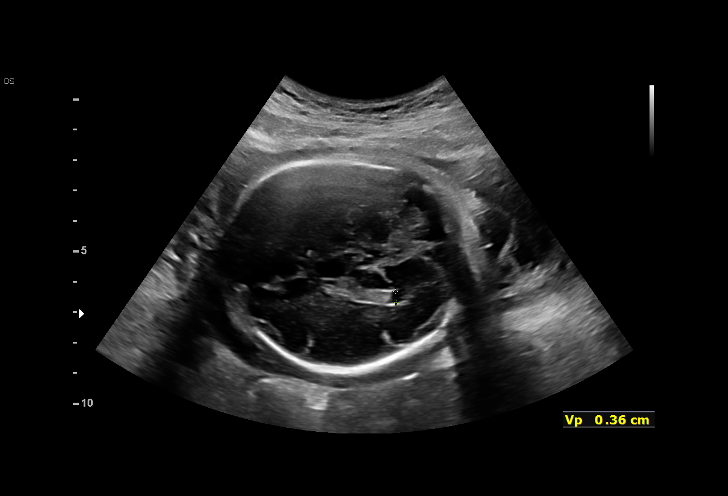
[im 8/31]
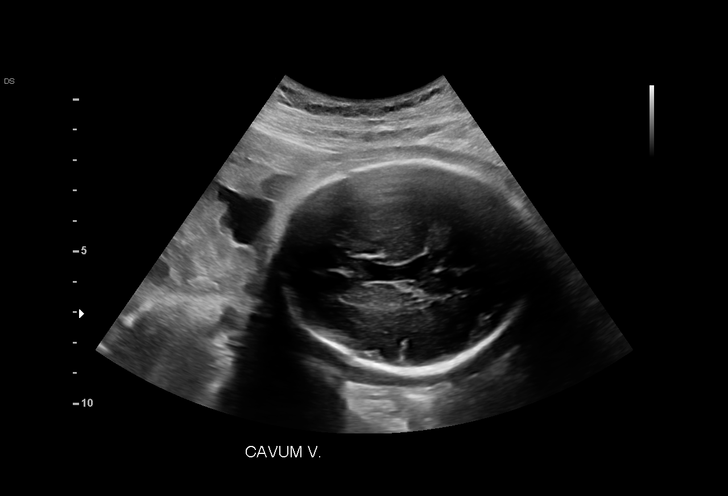
[im 11/31]
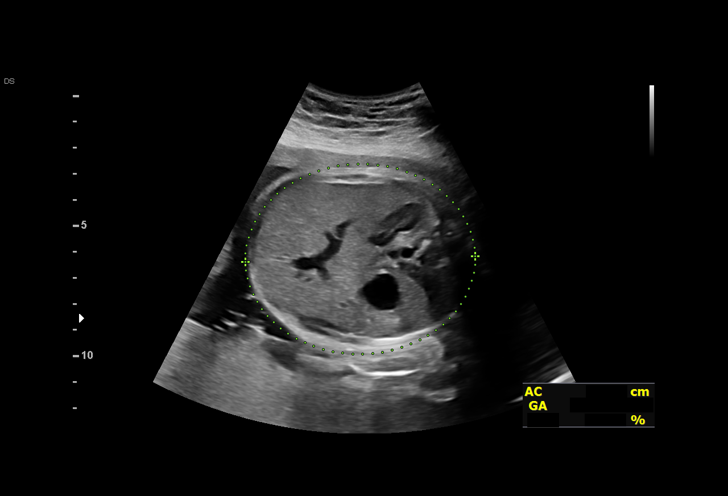
[im 13/31]
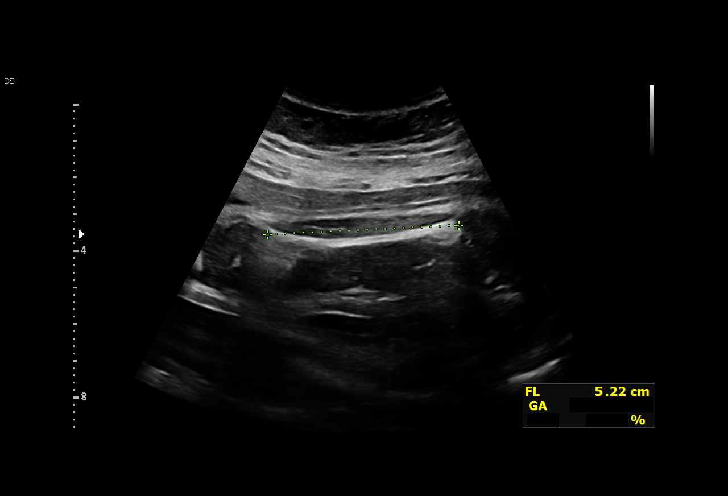
[im 15/31]
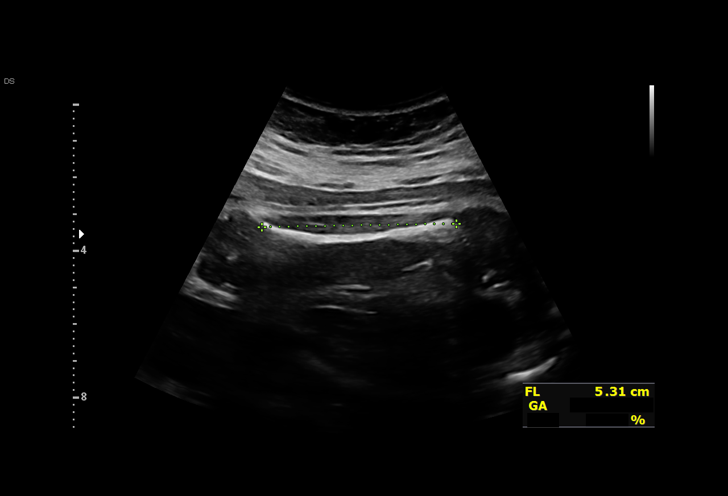
[im 17/31]
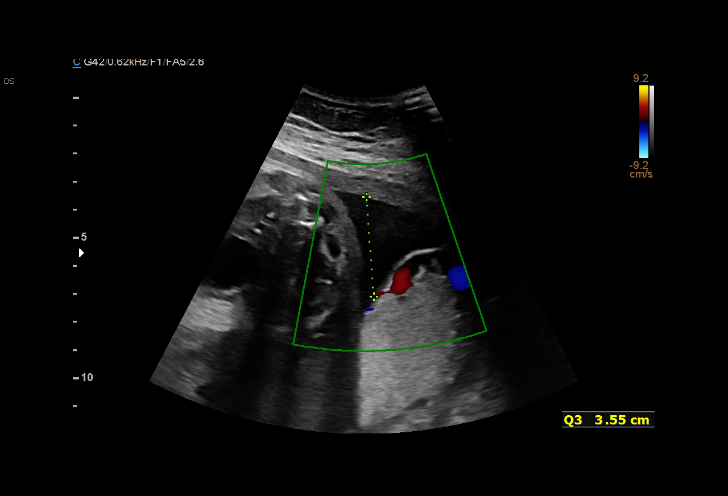
[im 19/31]
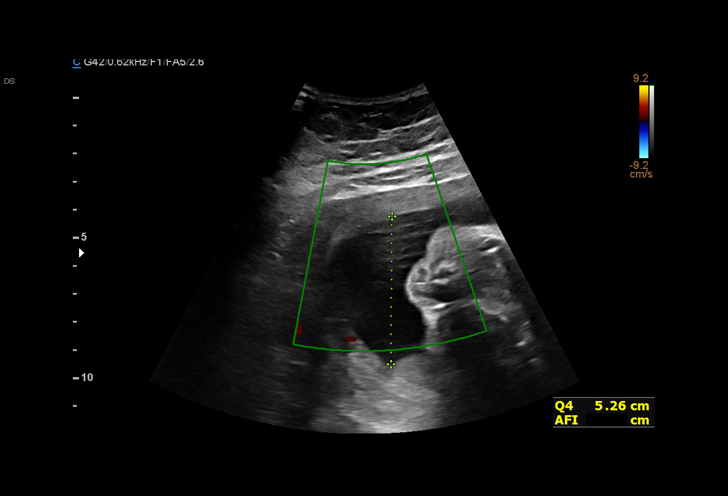
[im 22/31]
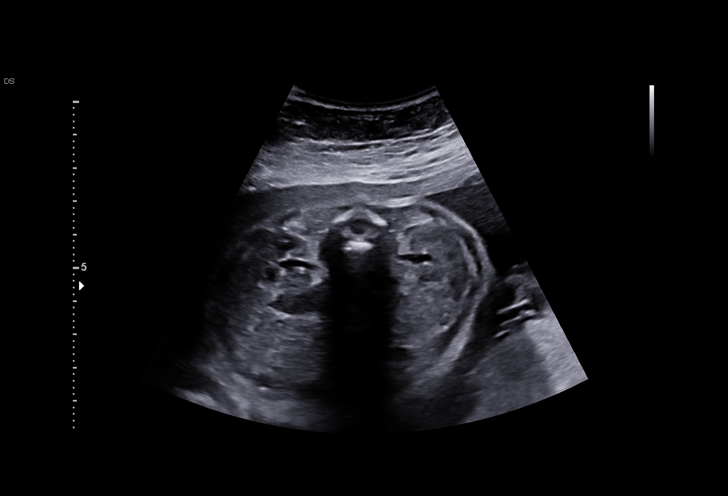
[im 24/31]
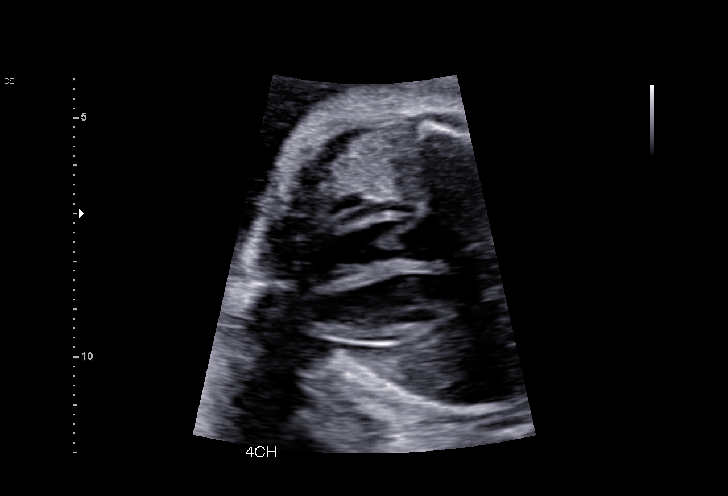
[im 26/31]
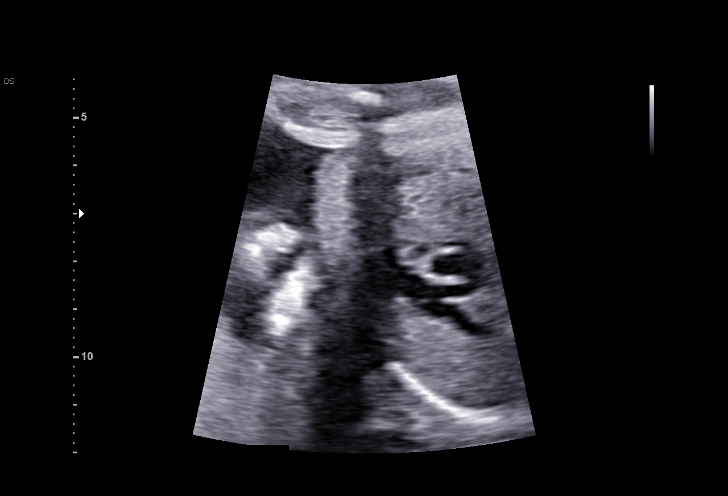
[im 28/31]
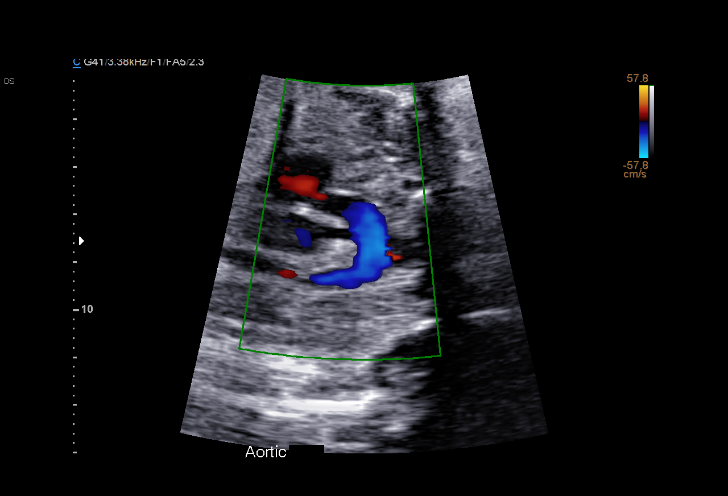
[im 31/31]
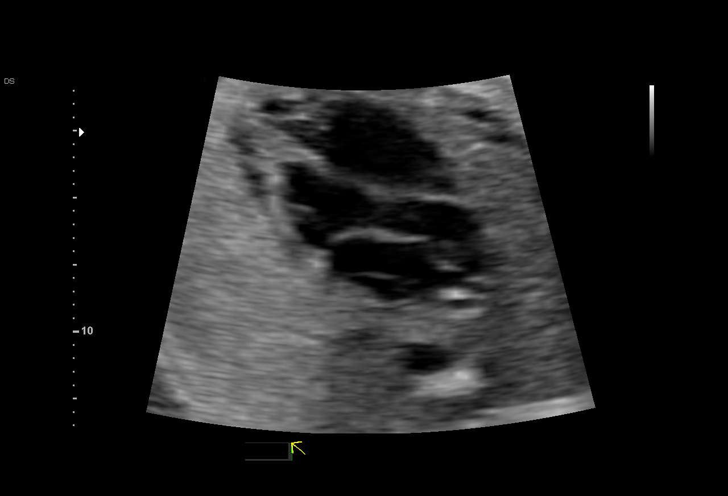

[14 of 28 positions shown; findings below may reference images not displayed]

SHIA

Indications

 Fetal arrhythmia affecting pregnancy,
 antepartum
 Gestational diabetes in pregnancy, diet
 controlled
 Encounter for other antenatal screening
 follow-up (AFP only done - neg)
 28 weeks gestation of pregnancy
Fetal Evaluation

 Num Of Fetuses:         1
 Cardiac Activity:       Observed - no arrhythmia noted
 Presentation:           Cephalic
 Placenta:               Fundal
 P. Cord Insertion:      Visualized, central

 Amniotic Fluid
 AFI FV:      Within normal limits

 AFI Sum(cm)     %Tile       Largest Pocket(cm)
 15.31           54

 RUQ(cm)       RLQ(cm)       LUQ(cm)        LLQ(cm)
 2.73          5.26          3
Biometry
 BPD:        70  mm     G. Age:  28w 1d         24  %    CI:        77.07   %    70 - 86
                                                         FL/HC:      21.0   %    19.6 -
 HC:      252.5  mm     G. Age:  27w 3d        2.9  %    HC/AC:      1.01        0.99 -
 AC:      251.1  mm     G. Age:  29w 2d         66  %    FL/BPD:     75.6   %    71 - 87
 FL:       52.9  mm     G. Age:  28w 1d         23  %    FL/AC:      21.1   %    20 - 24

 LV:        3.6  mm

 Est. FW:    3213  gm    2 lb 12 oz      40  %
OB History

 Gravidity:    2         Term:   0        Prem:   0        SAB:   1
 Living:       0
Gestational Age

 LMP:           28w 4d        Date:  03/09/20                 EDD:   12/14/20
 U/S Today:     28w 2d                                        EDD:   12/16/20
 Best:          28w 4d     Det. By:  LMP  (03/09/20)          EDD:   12/14/20
Anatomy

 Cranium:               Appears normal         Aortic Arch:            Appears normal
 Cavum:                 Appears normal         Ductal Arch:            Previously seen
 Ventricles:            Appears normal         Diaphragm:              Previously seen
 Choroid Plexus:        Appears normal         Stomach:                Appears normal, left
                                                                       sided
 Cerebellum:            Appears normal         Abdomen:                Previously seen
 Posterior Fossa:       Appears normal         Abdominal Wall:         Previously seen
 Nuchal Fold:           Not applicable (>20    Cord Vessels:           Not well visualized
                        wks GA)
 Face:                  Appears normal         Kidneys:                Appear normal
                        (orbits and profile)
 Lips:                  Appears normal         Bladder:                Appears normal
 Thoracic:              Previously seen        Spine:                  Previously seen
 Heart:                 Appears normal         Upper Extremities:      Previously seen
                        (4CH, axis, and
                        situs)
 RVOT:                  Appears normal         Lower Extremities:      Previously seen
 LVOT:                  Appears normal

 Other:  Other anatomy previously imaged and appeared normal.
Impression

 Gestational diabetes. Well controlled on diet.

 Blood pressure today at her office is 107/72 mmHg.

 Amniotic fluid is normal and good fetal activity is seen .Fetal
 growth is appropriate for gestational age . Previously seen
 fetal heart arrhythmia has resolved. Fetal heart rate and
 rhythm are normal
Recommendations

 -An appointment was made for her to return in 4 weeks for
 fetal growth assessment.
                 Tiger, Zeinab

## 2022-04-01 ENCOUNTER — Encounter: Payer: Self-pay | Admitting: Internal Medicine

## 2022-05-14 ENCOUNTER — Ambulatory Visit: Payer: Self-pay | Admitting: Internal Medicine

## 2022-05-14 ENCOUNTER — Encounter: Payer: Self-pay | Admitting: Internal Medicine

## 2022-05-14 VITALS — BP 114/80 | HR 64 | Resp 12 | Ht 60.25 in | Wt 135.0 lb

## 2022-05-14 DIAGNOSIS — Z658 Other specified problems related to psychosocial circumstances: Secondary | ICD-10-CM

## 2022-05-14 DIAGNOSIS — Z59869 Financial insecurity, unspecified: Secondary | ICD-10-CM

## 2022-05-14 DIAGNOSIS — Z0000 Encounter for general adult medical examination without abnormal findings: Secondary | ICD-10-CM

## 2022-05-14 DIAGNOSIS — R5383 Other fatigue: Secondary | ICD-10-CM

## 2022-05-14 DIAGNOSIS — R7303 Prediabetes: Secondary | ICD-10-CM

## 2022-05-14 DIAGNOSIS — Z5986 Financial insecurity: Secondary | ICD-10-CM

## 2022-05-14 MED ORDER — TERBINAFINE HCL 1 % EX CREA: 1.0000 | TOPICAL_CREAM | Freq: Two times a day (BID) | CUTANEOUS | 0 refills | Status: DC

## 2022-05-14 MED ORDER — TRIAMCINOLONE ACETONIDE 0.1 % EX CREA: 1.0000 | TOPICAL_CREAM | Freq: Two times a day (BID) | CUTANEOUS | 1 refills | Status: DC

## 2022-05-14 NOTE — Progress Notes (Signed)
Subjective:    Patient ID: Samantha Conner, female   DOB: [birth year], 36 y.o.   MRN: [id number]   HPI  CPE without pap  1.  Pap:  Last performed 06/11/2020 and normal.    2.  Mammogram:  Never.  No family history of breast cancer.    3.  Osteoprevention:  Drinks 2 cups almond milk daily.  Willing to increase to 3-4 cups daily.  Walks twice weekly in the afternoon.    4.  Guaiac Cards/FIT:  Never.    5.  Colonoscopy:  Never.  No family history of colon cancer.    6.  Immunizations:  Had COVID vaccination here in [facility].   7.  Glucose/Cholesterol:  Prediabetes noted in 11/2021 with A1C at 5.7%.  Discussed her weight appears to be gradually increasing with time.  Cholesterol fine save for slightly low HDL at 44 and trigs elevated at 159.  Discussed see both of these with increased weight and insulin resistance.    Current Meds  Medication Sig   acetaminophen (TYLENOL) 500 MG tablet Take 2 tablets (1,000 mg total) by mouth every 6 (six) hours as needed.   ibuprofen (ADVIL) 600 MG tablet Take 1 tablet (600 mg total) by mouth every 6 (six) hours as needed for fever or headache.   Prenatal Vit-DSS-Fe Fum-FA (PRENATAL 19) tablet Take 1 tablet by mouth daily.   No Known Allergies  Past Medical History:  Diagnosis Date   Fibroids    Gestational diabetes    PCOS (polycystic ovarian syndrome)     Past Surgical History:  Procedure Laterality Date   CESAREAN SECTION  12/06/2020   Procedure: CESAREAN SECTION;  Surgeon: Reva Bores, MD;  Location: [facility] LD ORS;  Service: Obstetrics;;   Family History  Problem Relation Age of Onset   Diabetes Mother    Polycystic ovary syndrome Sister    Hypertension Neg Hx    Social History   Socioeconomic History   Marital status: Married    Spouse name: Thomes Lolling   Number of children: 1   Years of education: Not on file   Highest education level: Not on file  Occupational History   Occupation: Homemaker  Tobacco Use   Smoking  status: Never    Passive exposure: Never   Smokeless tobacco: Never  Vaping Use   Vaping Use: Never used  Substance and Sexual Activity   Alcohol use: Never   Drug use: Never   Sexual activity: Not Currently    Birth control/protection: Condom  Other Topics Concern   Not on file  Social History Narrative   Lives at home with husband and their son.   Social Determinants of Health   Financial Resource Strain: High Risk (05/14/2022)   Overall Financial Resource Strain (CARDIA)    Difficulty of Paying Living Expenses: Hard  Food Insecurity: Food Insecurity Present (05/14/2022)   Hunger Vital Sign    Worried About Running Out of Food in the Last Year: Often true    Ran Out of Food in the Last Year: Often true  Transportation Needs: No Transportation Needs (05/14/2022)   PRAPARE - Administrator, Civil Service (Medical): No    Lack of Transportation (Non-Medical): No  Physical Activity: Not on file  Stress: Not on file  Social Connections: Not on file  Intimate Partner Violence: Not on file      Review of Systems  Constitutional:  Positive for fatigue (Poor sleep.  Up all  night still with her son--he is cosleeping with her most nights and uses her as pacifier.  She has no support system to get a break in [place].).  Eyes:  Positive for pain.  Skin:  Positive for rash (In between right middle and ring fingers.  She has used otc hyrdrocortisone cream, which dries it out, but if stops, returns.  Does itch.  Did try miconazole cream as well, but did not help.  A new problem. Her hands exposed to cleaning chems/H20).  Neurological:  Positive for headaches (Nuchal plus posterior neck pain.  Almost daily for past year after having baby.  She does nurse. Factor in fatigue.).      Objective:   BP 114/80 (BP Location: Right Arm, Patient Position: Sitting, Cuff Size: Normal)   Pulse 64   Resp 12   Ht 5' 0.25" (1.53 m)   Wt 135 lb (61.2 kg)   LMP 04/12/2022 (Exact Date)    Breastfeeding Yes   BMI 26.15 kg/m   Physical Exam Constitutional:      Appearance: Normal appearance.  HENT:     Head: Normocephalic and atraumatic.     Right Ear: Tympanic membrane, ear canal and external ear normal.     Left Ear: Tympanic membrane, ear canal and external ear normal.     Nose: Nose normal.     Mouth/Throat:     Mouth: Mucous membranes are moist.     Pharynx: Oropharynx is clear.  Eyes:     Extraocular Movements: Extraocular movements intact.     Conjunctiva/sclera: Conjunctivae normal.     Pupils: Pupils are equal, round, and reactive to light.     Comments: Discs sharp bilaterally  Neck:     Thyroid: No thyroid mass or thyromegaly.  Cardiovascular:     Rate and Rhythm: Normal rate and regular rhythm.     Pulses: Normal pulses.     Heart sounds: S1 normal and S2 normal. No murmur heard.    No S3 or S4 sounds.  Pulmonary:     Effort: Pulmonary effort is normal.     Breath sounds: Normal breath sounds and air entry.  Chest:  Breasts:    Right: No inverted nipple, mass or nipple discharge.     Left: No inverted nipple, mass or nipple discharge.  Abdominal:     General: Abdomen is flat. Bowel sounds are normal.     Palpations: Abdomen is soft. There is no hepatomegaly, splenomegaly or mass.     Tenderness: There is no abdominal tenderness.     Hernia: No hernia is present.  Genitourinary:    General: Normal vulva.     Vagina: Normal. No tenderness.     Uterus: Deviated (to patient's right adnexal area.).      Adnexa:        Right: No mass or tenderness.         Left: No mass or tenderness.    Musculoskeletal:        General: Normal range of motion.     Cervical back: Normal range of motion and neck supple.     Right lower leg: No edema.     Left lower leg: No edema.  Feet:     Right foot:     Skin integrity: Skin integrity normal.     Toenail Condition: Right toenails are normal.     Left foot:     Skin integrity: Skin integrity normal.      Toenail Condition: Left toenails are  normal.  Skin:    General: Skin is warm.     Capillary Refill: Capillary refill takes less than 2 seconds.     Findings: Rash (faint saddle like flaking with defined edge interdigital space between middle and ring finger on right hand.) present.  Neurological:     General: No focal deficit present.     Mental Status: She is alert and oriented to person, place, and time.     Cranial Nerves: Cranial nerves 2-12 are intact.     Sensory: Sensation is intact.     Motor: Motor function is intact.     Coordination: Coordination is intact.     Gait: Gait is intact.     Deep Tendon Reflexes: Reflexes are normal and symmetric.  Psychiatric:        Speech: Speech normal.        Behavior: Behavior normal. Behavior is cooperative.      Assessment & Plan    CPE without pap CBC, CMP, TSH, A1C Vaccines up to date Recommended influenza in the fall.  2.  Fatigue:  many factors, including husband's inability to work, resulting financial insecurity, housing insecurity, never getting a break from home/care of son.  Will ask Arlana Lindau and [employer] [place] to work with patient and family.  Husband does have an attorney to see about worker's comp.  Discussed at length difficult process of getting son into regular schedule at night with eating, bath, reading, music and bedtime.  To try to keep him in his own crib.  CBC, CMP, TSH  3.  Prediabetes: encouraged [employer] program participation, getting out more on walks daily, improved eating habits.  Until #2 is improved, however, not clear how well she will be able to address this.  4.  Interdigital dermatitis:  not clear if contact or wet to dry derm or fungal element to this as well.  OTC Terbinafine twice daily as well as triamcinolone cream twice daily.  Discussed may need to treat intermittently to control rash.  5.  Tension headache:  went over posture with nursing and sitting.  Suspect when stress is decreased, this  will improve as well.

## 2022-05-15 LAB — CBC WITH DIFFERENTIAL/PLATELET
Basophils Absolute: 0 10*3/uL (ref 0.0–0.2)
Basos: 1 %
EOS (ABSOLUTE): 0.2 10*3/uL (ref 0.0–0.4)
Eos: 4 %
Hematocrit: 39.3 % (ref 34.0–46.6)
Hemoglobin: 12.6 g/dL (ref 11.1–15.9)
Immature Grans (Abs): 0 10*3/uL (ref 0.0–0.1)
Immature Granulocytes: 0 %
Lymphocytes Absolute: 2.1 10*3/uL (ref 0.7–3.1)
Lymphs: 36 %
MCH: 28.8 pg (ref 26.6–33.0)
MCHC: 32.1 g/dL (ref 31.5–35.7)
MCV: 90 fL (ref 79–97)
Monocytes Absolute: 0.4 10*3/uL (ref 0.1–0.9)
Monocytes: 7 %
Neutrophils Absolute: 3.1 10*3/uL (ref 1.4–7.0)
Neutrophils: 52 %
Platelets: 195 10*3/uL (ref 150–450)
RBC: 4.38 x10E6/uL (ref 3.77–5.28)
RDW: 12.5 % (ref 11.7–15.4)
WBC: 5.9 10*3/uL (ref 3.4–10.8)

## 2022-05-15 LAB — COMPREHENSIVE METABOLIC PANEL
ALT: 17 IU/L (ref 0–32)
AST: 8 IU/L (ref 0–40)
Albumin/Globulin Ratio: 1.7 (ref 1.2–2.2)
Albumin: 4.3 g/dL (ref 3.9–4.9)
Alkaline Phosphatase: 79 IU/L (ref 44–121)
BUN/Creatinine Ratio: 20 (ref 9–23)
BUN: 11 mg/dL (ref 6–20)
Bilirubin Total: 0.6 mg/dL (ref 0.0–1.2)
CO2: 22 mmol/L (ref 20–29)
Calcium: 9 mg/dL (ref 8.7–10.2)
Chloride: 104 mmol/L (ref 96–106)
Creatinine, Ser: 0.56 mg/dL — ABNORMAL LOW (ref 0.57–1.00)
Globulin, Total: 2.5 g/dL (ref 1.5–4.5)
Glucose: 90 mg/dL (ref 70–99)
Potassium: 4 mmol/L (ref 3.5–5.2)
Sodium: 140 mmol/L (ref 134–144)
Total Protein: 6.8 g/dL (ref 6.0–8.5)
eGFR: 123 mL/min/{1.73_m2} (ref >59)

## 2022-05-15 LAB — HGB A1C W/O EAG: Hgb A1c MFr Bld: 5.7 % — ABNORMAL HIGH (ref 4.8–5.6)

## 2022-05-15 LAB — TSH: TSH: 3.49 u[IU]/mL (ref 0.450–4.500)

## 2022-08-19 ENCOUNTER — Ambulatory Visit: Payer: Self-pay | Admitting: Internal Medicine

## 2023-07-03 DIAGNOSIS — Z59869 Financial insecurity, unspecified: Secondary | ICD-10-CM | POA: Insufficient documentation

## 2023-07-03 DIAGNOSIS — Z5986 Financial insecurity: Secondary | ICD-10-CM | POA: Insufficient documentation

## 2023-07-03 DIAGNOSIS — R7303 Prediabetes: Secondary | ICD-10-CM | POA: Insufficient documentation

## 2023-07-03 DIAGNOSIS — Z658 Other specified problems related to psychosocial circumstances: Secondary | ICD-10-CM | POA: Insufficient documentation

## 2023-07-03 DIAGNOSIS — R5383 Other fatigue: Secondary | ICD-10-CM | POA: Insufficient documentation

## 2023-11-12 ENCOUNTER — Ambulatory Visit: Payer: Self-pay | Admitting: Internal Medicine

## 2023-11-12 ENCOUNTER — Telehealth: Payer: Self-pay | Admitting: Internal Medicine

## 2023-11-12 NOTE — Telephone Encounter (Signed)
Copied from CRM [id number]. Topic: Clinical - Red Word Triage >> Nov 12, 2023  2:45 PM Patsy Lager T wrote: Red Word that prompted transfer to Nurse Triage: patient called stated she is having headache, nausea and pain in stomach   1406: Patient called through E2C2. Redirected to contact [employer], phone number provided

## 2023-11-12 NOTE — Telephone Encounter (Signed)
Copied from CRM [id number]. Topic: Clinical - Red Word Triage >> Nov 12, 2023  2:45 PM Samantha Conner T wrote: Red Word that prompted transfer to Nurse Triage: patient called stated she is having headache, nausea and pain in stomach

## 2024-02-16 ENCOUNTER — Ambulatory Visit: Payer: Self-pay | Admitting: Internal Medicine

## 2024-02-16 ENCOUNTER — Encounter: Payer: Self-pay | Admitting: Internal Medicine

## 2024-02-16 VITALS — BP 100/80 | HR 67 | Resp 16 | Ht 60.0 in | Wt 138.0 lb

## 2024-02-16 DIAGNOSIS — R7303 Prediabetes: Secondary | ICD-10-CM

## 2024-02-16 DIAGNOSIS — R1030 Lower abdominal pain, unspecified: Secondary | ICD-10-CM

## 2024-02-16 DIAGNOSIS — Z9189 Other specified personal risk factors, not elsewhere classified: Secondary | ICD-10-CM

## 2024-02-16 LAB — POCT URINALYSIS DIPSTICK
Bilirubin, UA: NEGATIVE
Glucose, UA: NEGATIVE
Ketones, UA: NEGATIVE
Leukocytes, UA: NEGATIVE
Nitrite, UA: NEGATIVE
Protein, UA: NEGATIVE
Spec Grav, UA: 1.025 (ref 1.010–1.025)
Urobilinogen, UA: 0.2 U/dL
pH, UA: 6 (ref 5.0–8.0)

## 2024-02-16 NOTE — Progress Notes (Signed)
     Subjective:    Patient ID: Samantha Conner, female   DOB: 06-20-1986, 38 y.o.   MRN: [id number]   HPI  Samantha Conner interprets Has not been seen since 04/2022   Abdominal pain:  Made this appointment for the pain, but no longer having any longer.  Bilateral lower quads with discomfort.  Did have associated constipation and diarrhea.  Lasted about a month, starting in December.  Made dietary changes and pain resolved mid January. Was stressed at the time.  Stress is less now.  2.  Need for dental care.    Current Meds  Medication Sig   acetaminophen  (TYLENOL ) 500 MG tablet Take 2 tablets (1,000 mg total) by mouth every 6 (six) hours as needed.   ferrous sulfate  325 (65 FE) MG tablet Take 1 tablet (325 mg total) by mouth every other day.   ibuprofen  (ADVIL ) 600 MG tablet Take 1 tablet (600 mg total) by mouth every 6 (six) hours as needed for fever or headache.   triamcinolone  cream (KENALOG ) 0.1 % Apply 1 Application topically 2 (two) times daily. (Patient taking differently: Apply 1 Application topically 2 (two) times daily. As needed)   No Known Allergies   Review of Systems  Gastrointestinal:  Negative for abdominal pain, blood in stool (No melena), constipation and diarrhea.      Objective:   BP 100/80 (BP Location: Left Arm, Patient Position: Sitting, Cuff Size: Normal)   Pulse 67   Resp 16   Wt 138 lb (62.6 kg)   LMP 02/11/2024   SpO2 99%   BMI 26.73 kg/m   Physical Exam NAD HEENT:  PERRL, EOMI, TMs pearly gray, throat without injection.  Teeth in generally good repair. Neck:  Supple, No adenopathy, no thyromegaly Chest:  CTA CV:  RRR with normal S1 and S2, No S3, S4 or murmur.  No carotid bruits.  Carotid, radial and DP pulses normal and equal Abd:  S, NT, No HSM or mass, + BS LE:  No edema   Assessment & Plan   History of abdominal discomfort:  no symptoms currently and normal exam.  CBC, CMP  2.  Need for dental care:  Dental referral.  3.   Prediabetes:  A1C, FLP

## 2024-02-17 LAB — COMPREHENSIVE METABOLIC PANEL WITH GFR
ALT: 14 IU/L (ref 0–32)
AST: 12 IU/L (ref 0–40)
Albumin: 4.6 g/dL (ref 3.9–4.9)
Alkaline Phosphatase: 80 IU/L (ref 44–121)
BUN/Creatinine Ratio: 23 (ref 9–23)
BUN: 14 mg/dL (ref 6–20)
Bilirubin Total: 0.4 mg/dL (ref 0.0–1.2)
CO2: 22 mmol/L (ref 20–29)
Calcium: 9.1 mg/dL (ref 8.7–10.2)
Chloride: 105 mmol/L (ref 96–106)
Creatinine, Ser: 0.62 mg/dL (ref 0.57–1.00)
Globulin, Total: 2.7 g/dL (ref 1.5–4.5)
Glucose: 97 mg/dL (ref 70–99)
Potassium: 4 mmol/L (ref 3.5–5.2)
Sodium: 141 mmol/L (ref 134–144)
Total Protein: 7.3 g/dL (ref 6.0–8.5)
eGFR: 118 mL/min/{1.73_m2} (ref >59)

## 2024-02-17 LAB — CBC WITH DIFFERENTIAL/PLATELET
Basophils Absolute: 0 10*3/uL (ref 0.0–0.2)
Basos: 0 %
EOS (ABSOLUTE): 0.2 10*3/uL (ref 0.0–0.4)
Eos: 3 %
Hematocrit: 40.8 % (ref 34.0–46.6)
Hemoglobin: 13.1 g/dL (ref 11.1–15.9)
Immature Grans (Abs): 0 10*3/uL (ref 0.0–0.1)
Immature Granulocytes: 0 %
Lymphocytes Absolute: 2.2 10*3/uL (ref 0.7–3.1)
Lymphs: 38 %
MCH: 28.6 pg (ref 26.6–33.0)
MCHC: 32.1 g/dL (ref 31.5–35.7)
MCV: 89 fL (ref 79–97)
Monocytes Absolute: 0.4 10*3/uL (ref 0.1–0.9)
Monocytes: 7 %
Neutrophils Absolute: 3 10*3/uL (ref 1.4–7.0)
Neutrophils: 52 %
Platelets: 212 10*3/uL (ref 150–450)
RBC: 4.58 x10E6/uL (ref 3.77–5.28)
RDW: 12.9 % (ref 11.7–15.4)
WBC: 5.8 10*3/uL (ref 3.4–10.8)

## 2024-02-17 LAB — LIPID PANEL W/O CHOL/HDL RATIO
Cholesterol, Total: 175 mg/dL (ref 100–199)
HDL: 35 mg/dL — ABNORMAL LOW (ref >39)
LDL Chol Calc (NIH): 107 mg/dL — ABNORMAL HIGH (ref 0–99)
Triglycerides: 190 mg/dL — ABNORMAL HIGH (ref 0–149)
VLDL Cholesterol Cal: 33 mg/dL (ref 5–40)

## 2024-02-17 LAB — HGB A1C W/O EAG: Hgb A1c MFr Bld: 5.7 % — ABNORMAL HIGH (ref 4.8–5.6)

## 2024-04-30 ENCOUNTER — Encounter (HOSPITAL_COMMUNITY): Payer: Self-pay | Admitting: *Deleted

## 2024-04-30 ENCOUNTER — Emergency Department (HOSPITAL_COMMUNITY): Payer: Self-pay

## 2024-04-30 ENCOUNTER — Other Ambulatory Visit: Payer: Self-pay

## 2024-04-30 ENCOUNTER — Emergency Department (HOSPITAL_COMMUNITY)
Admission: EM | Admit: 2024-04-30 | Discharge: 2024-04-30 | Disposition: A | Discharge status: Home / Self Care | Payer: Self-pay | Attending: Emergency Medicine | Admitting: Emergency Medicine

## 2024-04-30 DIAGNOSIS — K5900 Constipation, unspecified: Secondary | ICD-10-CM | POA: Insufficient documentation

## 2024-04-30 DIAGNOSIS — R300 Dysuria: Secondary | ICD-10-CM | POA: Insufficient documentation

## 2024-04-30 LAB — COMPREHENSIVE METABOLIC PANEL WITH GFR
ALT: 18 U/L (ref 0–44)
AST: 15 U/L (ref 15–41)
Albumin: 3.8 g/dL (ref 3.5–5.0)
Alkaline Phosphatase: 56 U/L (ref 38–126)
Anion gap: 11 (ref 5–15)
BUN: 12 mg/dL (ref 6–20)
CO2: 26 mmol/L (ref 22–32)
Calcium: 9 mg/dL (ref 8.9–10.3)
Chloride: 101 mmol/L (ref 98–111)
Creatinine, Ser: 0.57 mg/dL (ref 0.44–1.00)
GFR, Estimated: >60 mL/min (ref >60)
Glucose, Bld: 102 mg/dL — ABNORMAL HIGH (ref 70–99)
Potassium: 4 mmol/L (ref 3.5–5.1)
Sodium: 138 mmol/L (ref 135–145)
Total Bilirubin: 0.9 mg/dL (ref 0.0–1.2)
Total Protein: 7.3 g/dL (ref 6.5–8.1)

## 2024-04-30 LAB — CBC
HCT: 40.5 % (ref 36.0–46.0)
Hemoglobin: 13.4 g/dL (ref 12.0–15.0)
MCH: 29 pg (ref 26.0–34.0)
MCHC: 33.1 g/dL (ref 30.0–36.0)
MCV: 87.7 fL (ref 80.0–100.0)
Platelets: 203 K/uL (ref 150–400)
RBC: 4.62 MIL/uL (ref 3.87–5.11)
RDW: 12.6 % (ref 11.5–15.5)
WBC: 5.5 K/uL (ref 4.0–10.5)
nRBC: 0 % (ref 0.0–0.2)

## 2024-04-30 LAB — URINALYSIS, ROUTINE W REFLEX MICROSCOPIC
Bilirubin Urine: NEGATIVE
Glucose, UA: NEGATIVE mg/dL
Hgb urine dipstick: NEGATIVE
Ketones, ur: NEGATIVE mg/dL
Nitrite: NEGATIVE
Protein, ur: NEGATIVE mg/dL
Specific Gravity, Urine: 1.023 (ref 1.005–1.030)
pH: 5 (ref 5.0–8.0)

## 2024-04-30 LAB — LIPASE, BLOOD: Lipase: 30 U/L (ref 11–51)

## 2024-04-30 LAB — HCG, SERUM, QUALITATIVE: Preg, Serum: NEGATIVE

## 2024-04-30 MED ORDER — POLYETHYLENE GLYCOL 3350 17 G PO PACK: 17.0000 g | PACK | Freq: Two times a day (BID) | ORAL | 0 refills | Status: DC

## 2024-04-30 MED ORDER — MORPHINE SULFATE (PF) 4 MG/ML IV SOLN
4.0000 mg | Freq: Once | INTRAVENOUS | Status: AC
  Administered 2024-04-30: 4 mg via INTRAVENOUS
  Filled 2024-04-30: qty 1

## 2024-04-30 MED ORDER — ONDANSETRON HCL 4 MG/2ML IJ SOLN
4.0000 mg | Freq: Once | INTRAMUSCULAR | Status: AC
  Administered 2024-04-30: 4 mg via INTRAVENOUS
  Filled 2024-04-30: qty 2

## 2024-04-30 MED ORDER — IOHEXOL 350 MG/ML SOLN
75.0000 mL | Freq: Once | INTRAVENOUS | Status: AC | PRN
  Administered 2024-04-30: 75 mL via INTRAVENOUS

## 2024-04-30 MED ORDER — LACTATED RINGERS IV SOLN: INTRAVENOUS | Status: DC

## 2024-04-30 NOTE — ED Notes (Signed)
 Pt to CT

## 2024-04-30 NOTE — ED Triage Notes (Signed)
 Patient c/o left flank pain  with vomiitng off and on x 2weeks , States she has had problems having a BM x 2 weeks as well. C/o pressure with urination all info from interputer line

## 2024-04-30 NOTE — Discharge Instructions (Addendum)
 As we discussed, your CT scan showed constipation  I recommend you take MiraLAX  twice daily for a week to help you with constipation and please stay hydrated  See your doctor for follow-up  Return to ER if you have severe abdominal pain or vomiting

## 2024-04-30 NOTE — ED Provider Notes (Signed)
  Physical Exam  BP (!) 124/98   Pulse 77   Temp 99.2 F (37.3 C) (Oral)   Resp 18   Ht 5' (1.524 m)   Wt 62 kg   SpO2 100%   BMI 26.69 kg/m   Physical Exam  Procedures  Procedures  ED Course / MDM    Medical Decision Making Care assumed at 3 PM.  Patient is here with abdominal pain and signed out pending CT abdomen pelvis  4:00 PM CT showed constipation.  Patient states that she is constipated all the time and did not remember when the last normal bowel movement was.  She states that her bowel movements are always hard.  I think this is contributing to her abdominal pain.  Recommend MiraLAX  twice daily and follow-up outpatient with her doctor  Problems Addressed: Constipation, unspecified constipation type: acute illness or injury  Amount and/or Complexity of Data Reviewed Labs: ordered. Decision-making details documented in ED Course. Radiology: ordered and independent interpretation performed. Decision-making details documented in ED Course.  Risk Prescription drug management.         Patt Alm Macho, MD 04/30/24 [phone]

## 2024-04-30 NOTE — ED Provider Notes (Signed)
 [place] EMERGENCY DEPARTMENT AT [facility] Provider Note   CSN: [id number] Arrival date & time: 04/30/24  1222     Patient presents with: Abdominal Pain   Samantha Conner is a 38 y.o. female.   Patient is a 38 year old female with a history of fibroids, PCOS who is presenting today with complaints of abdominal pain.  Patient reports the pain started about 2 weeks ago and has been intermittent but has seem to be worsening in the last few days.  It is a pain she feels on her left side that goes into her left flank but then also a intermittent pain across to her lower abdomen worse in the left lower quadrant.  She also reports a history of some mild constipation but reports even when she does have a bowel movement it does not seem to take the pain away.  Last menses was last week but her periods are irregular.  She is no longer having any vaginal bleeding.  She does report at times she will get some dysuria but does not have frequency or urgency.  She reports she had pain like this in last December but she changed her diet and the symptoms resolved until 2 weeks ago.  She has had occasional vomiting but denies any nausea or vomiting today.  No fevers.  Prior C-section but no other abdominal surgeries.  The history is provided by the patient. The history is limited by a language barrier. A language interpreter was used [place]).  Abdominal Pain      Prior to Admission medications   Medication Sig Start Date End Date Taking? Authorizing Provider  acetaminophen  (TYLENOL ) 500 MG tablet Take 2 tablets (1,000 mg total) by mouth every 6 (six) hours as needed. 12/08/20   Firestone, Alicia C, MD  coconut oil OIL Apply 1 application topically as needed. Patient not taking: Reported on 02/16/2024 12/08/20   Firestone, Alicia C, MD  ferrous sulfate  325 (65 FE) MG tablet Take 1 tablet (325 mg total) by mouth every other day. 12/08/20   Firestone, Alicia C, MD  ibuprofen  (ADVIL ) 600 MG  tablet Take 1 tablet (600 mg total) by mouth every 6 (six) hours as needed for fever or headache. 12/08/20   Firestone, Alicia C, MD  medroxyPROGESTERone (PROVERA) 10 MG tablet Take 1 tablet by mouth daily. Patient not taking: Reported on 02/16/2024 10/25/18   [provider]  omeprazole  (PRILOSEC) 20 MG capsule Take 1 capsule (20 mg total) by mouth daily. Patient not taking: Reported on 02/16/2024 04/29/21   Theadore Ozell HERO, MD  oxyCODONE  (OXY IR/ROXICODONE ) 5 MG immediate release tablet Take 1 tablet (5 mg total) by mouth every 6 (six) hours as needed for moderate pain. Patient not taking: Reported on 02/16/2024 12/08/20   Firestone, Alicia C, MD  Prenatal Vit-DSS-Fe Fum-FA (PRENATAL 19) tablet Take 1 tablet by mouth daily. Patient not taking: Reported on 02/16/2024    [provider]  sucralfate  (CARAFATE ) 1 g tablet Take 1 tablet (1 g total) by mouth 4 (four) times daily as needed. Patient not taking: Reported on 02/16/2024 04/29/21   Theadore Ozell HERO, MD  terbinafine  (LAMISIL ) 1 % cream Apply 1 Application topically 2 (two) times daily. Patient not taking: Reported on 02/16/2024 05/14/22   Adella Norris, MD  triamcinolone  cream (KENALOG ) 0.1 % Apply 1 Application topically 2 (two) times daily. Patient taking differently: Apply 1 Application topically 2 (two) times daily. As needed 05/14/22   Adella Norris, MD  Allergies: Patient has no known allergies.    Review of Systems  Gastrointestinal:  Positive for abdominal pain.    Updated Vital Signs BP (!) 124/98   Pulse 77   Temp 99.2 F (37.3 C) (Oral)   Resp 18   Ht 5' (1.524 m)   Wt 62 kg   SpO2 100%   BMI 26.69 kg/m   Physical Exam Vitals and nursing note reviewed.  Constitutional:      General: She is not in acute distress.    Appearance: She is well-developed.  HENT:     Head: Normocephalic and atraumatic.  Eyes:     Pupils: Pupils are equal, round, and reactive to light.  Cardiovascular:     Rate  and Rhythm: Normal rate and regular rhythm.     Heart sounds: Normal heart sounds. No murmur heard.    No friction rub.  Pulmonary:     Effort: Pulmonary effort is normal.     Breath sounds: Normal breath sounds. No wheezing or rales.  Abdominal:     General: Bowel sounds are normal. There is no distension.     Palpations: Abdomen is soft.     Tenderness: There is abdominal tenderness in the left lower quadrant. There is left CVA tenderness and guarding. There is no rebound.  Musculoskeletal:        General: No tenderness. Normal range of motion.     Comments: No edema  Skin:    General: Skin is warm and dry.     Findings: No rash.  Neurological:     Mental Status: She is alert and oriented to person, place, and time.     Cranial Nerves: No cranial nerve deficit.  Psychiatric:        Behavior: Behavior normal.     (all labs ordered are listed, but only abnormal results are displayed) Labs Reviewed  COMPREHENSIVE METABOLIC PANEL WITH GFR - Abnormal; Notable for the following components:      Result Value   Glucose, Bld 102 (*)    All other components within normal limits  URINALYSIS, ROUTINE W REFLEX MICROSCOPIC - Abnormal; Notable for the following components:   APPearance HAZY (*)    Leukocytes,Ua TRACE (*)    Bacteria, UA RARE (*)    All other components within normal limits  LIPASE, BLOOD  CBC  HCG, SERUM, QUALITATIVE    EKG: None  Radiology: No results found.   Procedures   Medications Ordered in the ED  lactated ringers  infusion ( Intravenous New Bag/Given 04/30/24 1437)  ondansetron  (ZOFRAN ) injection 4 mg (4 mg Intravenous Given 04/30/24 1434)  morphine  (PF) 4 MG/ML injection 4 mg (4 mg Intravenous Given 04/30/24 1434)  iohexol  (OMNIPAQUE ) 350 MG/ML injection 75 mL (75 mLs Intravenous Contrast Given 04/30/24 1514)                                    Medical Decision Making Amount and/or Complexity of Data Reviewed Labs: ordered. Decision-making details  documented in ED Course. Radiology: ordered. ECG/medicine tests: ordered and independent interpretation performed. Decision-making details documented in ED Course.  Risk Prescription drug management.   Pt  presenting today with a complaint that caries a high risk for morbidity and mortality. Here today with the above complaints, pain.  Concern for ectopic pregnancy, TOA, ovarian cyst, diverticulitis, pyelonephritis, kidney stone, abdominal abscess and perforation.  Symptoms are not consistent with cholecystitis, hepatitis or  appendicitis.  I independently interpreted patient's labs and lipase is within normal limits, CMP without acute findings, CBC within normal limits, UA without acute findings, hCG neg.  CT is pending.     Final diagnoses:  None    ED Discharge Orders     None          Doretha Folks, MD 04/30/24 1521

## 2024-07-21 ENCOUNTER — Other Ambulatory Visit: Payer: Self-pay | Admitting: Obstetrics & Gynecology

## 2024-07-21 DIAGNOSIS — Z36 Encounter for antenatal screening for chromosomal anomalies: Secondary | ICD-10-CM

## 2024-08-23 ENCOUNTER — Encounter: Payer: Self-pay | Admitting: Internal Medicine

## 2024-08-29 ENCOUNTER — Encounter: Payer: Self-pay | Admitting: *Deleted

## 2024-08-29 DIAGNOSIS — O09529 Supervision of elderly multigravida, unspecified trimester: Secondary | ICD-10-CM | POA: Insufficient documentation

## 2024-09-12 ENCOUNTER — Ambulatory Visit (HOSPITAL_BASED_OUTPATIENT_CLINIC_OR_DEPARTMENT_OTHER): Payer: Self-pay | Admitting: Obstetrics

## 2024-09-12 ENCOUNTER — Ambulatory Visit: Payer: Self-pay

## 2024-09-12 ENCOUNTER — Other Ambulatory Visit: Payer: Self-pay | Admitting: *Deleted

## 2024-09-12 ENCOUNTER — Ambulatory Visit: Payer: Self-pay | Attending: Obstetrics & Gynecology

## 2024-09-12 VITALS — BP 102/71

## 2024-09-12 DIAGNOSIS — O09522 Supervision of elderly multigravida, second trimester: Secondary | ICD-10-CM | POA: Insufficient documentation

## 2024-09-12 DIAGNOSIS — Z3A16 16 weeks gestation of pregnancy: Secondary | ICD-10-CM

## 2024-09-12 DIAGNOSIS — Z3A20 20 weeks gestation of pregnancy: Secondary | ICD-10-CM | POA: Insufficient documentation

## 2024-09-12 DIAGNOSIS — Z36 Encounter for antenatal screening for chromosomal anomalies: Secondary | ICD-10-CM | POA: Insufficient documentation

## 2024-09-12 DIAGNOSIS — O09292 Supervision of pregnancy with other poor reproductive or obstetric history, second trimester: Secondary | ICD-10-CM

## 2024-09-12 DIAGNOSIS — O358XX0 Maternal care for other (suspected) fetal abnormality and damage, not applicable or unspecified: Secondary | ICD-10-CM

## 2024-09-12 DIAGNOSIS — O34219 Maternal care for unspecified type scar from previous cesarean delivery: Secondary | ICD-10-CM

## 2024-09-12 NOTE — Progress Notes (Signed)
 MFM Consult Note  Samantha Conner is currently at [redacted]w[redacted]d. She was seen today for a detailed fetal anatomy scan due to advanced maternal age (38 years old).  She denies any significant past medical history and denies any problems in her current pregnancy.    She has not had a screening test for fetal aneuploidy drawn in her current pregnancy.    This is the first ultrasound that she has had in her current pregnancy.  Sonographic findings Single intrauterine pregnancy at 16w 4d. Fetal cardiac activity:  Observed and appears normal. Presentation: Cephalic. Based on the fetal biometry measurements obtained today, her EDC was changed to Feb 23, 2025, making her 16 weeks and 4 days pregnant today. The views of the fetal anatomy were limited today due to her early gestational age. Fetal biometry shows the estimated fetal weight of 0 lb 6 oz, 163 grams (46%). Amniotic fluid: Within normal limits.  MVP: 3.42 cm. Placenta: Posterior. Adnexa: No abnormality visualized. Cervical length: 4.5 cm.    The patient was informed that anomalies may be missed due to technical limitations. If the fetus is in a suboptimal position or maternal habitus is increased, visualization of the fetus in the maternal uterus may be impaired.  Advanced maternal age  The increased risk of fetal aneuploidy due to advanced maternal age was discussed.   Due to advanced maternal age, the patient was offered and declined an amniocentesis today for definitive diagnosis of fetal aneuploidy.   She had the [employer] cell free DNA test drawn following today's ultrasound exam to screen for fetal aneuploidy.  Our genetic counselor will notify the patient regarding the results of this test.  A follow-up exam was scheduled in 5 weeks to assess the confirm her dates and to reassess the views of the fetal anatomy.    All conversations were held with the patient today with the help of a Spanish interpreter.    The patient stated that  all of her questions were answered.   A total of 30 minutes was spent counseling and coordinating the care for this patient.  Greater than 50% of the time was spent in direct face-to-face contact.

## 2024-09-20 ENCOUNTER — Telehealth: Payer: Self-pay

## 2024-09-20 NOTE — Telephone Encounter (Signed)
 PAC INTERPRETER: Darene PI#[id number]  Patient spouse called inquiring about financial assistance for the [place]  bill they received. Provided him with the phone number for billing and the option to pick up an application from our office.

## 2024-09-21 LAB — PANORAMA PRENATAL TEST FULL PANEL:PANORAMA TEST PLUS 5 ADDITIONAL MICRODELETIONS

## 2024-10-18 ENCOUNTER — Ambulatory Visit: Payer: Self-pay | Attending: Obstetrics and Gynecology | Admitting: Obstetrics and Gynecology

## 2024-10-18 ENCOUNTER — Other Ambulatory Visit: Payer: Self-pay | Admitting: *Deleted

## 2024-10-18 ENCOUNTER — Ambulatory Visit (HOSPITAL_BASED_OUTPATIENT_CLINIC_OR_DEPARTMENT_OTHER): Payer: Self-pay

## 2024-10-18 DIAGNOSIS — Z362 Encounter for other antenatal screening follow-up: Secondary | ICD-10-CM | POA: Insufficient documentation

## 2024-10-18 DIAGNOSIS — O34219 Maternal care for unspecified type scar from previous cesarean delivery: Secondary | ICD-10-CM | POA: Insufficient documentation

## 2024-10-18 DIAGNOSIS — O358XX0 Maternal care for other (suspected) fetal abnormality and damage, not applicable or unspecified: Secondary | ICD-10-CM

## 2024-10-18 DIAGNOSIS — O09292 Supervision of pregnancy with other poor reproductive or obstetric history, second trimester: Secondary | ICD-10-CM

## 2024-10-18 DIAGNOSIS — O09523 Supervision of elderly multigravida, third trimester: Secondary | ICD-10-CM

## 2024-10-18 DIAGNOSIS — Z3A21 21 weeks gestation of pregnancy: Secondary | ICD-10-CM

## 2024-10-18 DIAGNOSIS — O09522 Supervision of elderly multigravida, second trimester: Secondary | ICD-10-CM

## 2024-10-18 NOTE — Progress Notes (Signed)
 Maternal-Fetal Medicine Consultation  Name: Samantha Conner  MRN: [id number]  GA: [id number] [redacted]w[redacted]d   Patient is here for completion of fetal anatomy.  Advanced maternal age.  On cell-free fetal DNA screening, the risks of fetal aneuploidies are not increased.  Obstetrical history significant for a term cesarean delivery in 2022 of a female infant weighing 8 pounds and 5 ounces at birth.  Cesarean section was performed because of arrest of descent. Her previous pregnancy was complicated by gestational diabetes.    In this pregnancy, patient does not have gestational diabetes on early screening.  Ultrasound Normal fetal growth and amniotic fluid.  Fetal anatomical survey was completed and appears normal.  Placenta is posterior and there is no evidence of previa or placenta accreta spectrum. I counseled the patient with the help of Spanish language interpreter present in the room.  Previous cesarean delivery I reassured the patient of normal placental location and that there is no evidence of previa or placenta accreta spectrum.   I discussed the benefits and risks of vaginal birth after cesarean section (VBAC).  The risk of uterine scar dehiscence is about 1%.  Cesarean deliveries increase the risks of hemorrhage, infection and venous thromboembolism.  Repeat cesarean deliveries increase the risks of placenta previa and/or placenta accreta spectrum. Long-term complications include small bowel obstruction.  Based on [facility] calculator, the likelihood of successful vaginal delivery is 40%. Patient was counseled that this is only an approximate calculation.  Patient will discuss with her provider and decide on mode of delivery.  Recommendations Fetal growth assessment in 10 weeks     Consultation including face-to-face (more than 50%) counseling 20 minutes.

## 2024-12-27 ENCOUNTER — Ambulatory Visit: Payer: Self-pay

## 2025-03-28 ENCOUNTER — Encounter: Payer: Self-pay | Admitting: Internal Medicine
